# Patient Record
Sex: Male | Born: 1940
Health system: Southern US, Community
[De-identification: ages and names within clinical notes are randomized; demographics above are authoritative.]

---

## 2010-04-26 ENCOUNTER — Emergency Department (HOSPITAL_BASED_OUTPATIENT_CLINIC_OR_DEPARTMENT_OTHER): Admission: EM | Admit: 2010-04-26 | Discharge: 2010-04-26 | Payer: Self-pay | Admitting: Emergency Medicine

## 2010-04-26 ENCOUNTER — Ambulatory Visit: Payer: Self-pay | Admitting: Diagnostic Radiology

## 2010-11-27 LAB — DIFFERENTIAL
Basophils Absolute: 0 10*3/uL (ref 0.0–0.1)
Basophils Relative: 1 % (ref 0–1)
Eosinophils Relative: 1 % (ref 0–5)
Lymphocytes Relative: 20 % (ref 12–46)
Monocytes Relative: 9 % (ref 3–12)
Neutro Abs: 7 10*3/uL (ref 1.7–7.7)
Neutrophils Relative %: 69 % (ref 43–77)

## 2010-11-27 LAB — CBC
Hemoglobin: 13 g/dL (ref 13.0–17.0)
MCH: 29.5 pg (ref 26.0–34.0)
MCV: 86.9 fL (ref 78.0–100.0)
Platelets: 318 10*3/uL (ref 150–400)

## 2012-01-05 DIAGNOSIS — H0230 Blepharochalasis unspecified eye, unspecified eyelid: Secondary | ICD-10-CM | POA: Insufficient documentation

## 2012-03-09 ENCOUNTER — Other Ambulatory Visit (HOSPITAL_COMMUNITY): Payer: Self-pay | Admitting: Urology

## 2012-03-09 DIAGNOSIS — N179 Acute kidney failure, unspecified: Secondary | ICD-10-CM

## 2012-03-09 DIAGNOSIS — N1330 Unspecified hydronephrosis: Secondary | ICD-10-CM

## 2012-03-13 ENCOUNTER — Encounter (HOSPITAL_COMMUNITY)
Admission: RE | Admit: 2012-03-13 | Discharge: 2012-03-13 | Disposition: A | Discharge status: Home / Self Care | Payer: Commercial Managed Care - PPO | Source: Ambulatory Visit | Attending: Urology | Admitting: Urology

## 2012-03-13 DIAGNOSIS — N1330 Unspecified hydronephrosis: Secondary | ICD-10-CM

## 2012-03-13 DIAGNOSIS — N179 Acute kidney failure, unspecified: Secondary | ICD-10-CM

## 2012-03-13 DIAGNOSIS — N2889 Other specified disorders of kidney and ureter: Secondary | ICD-10-CM | POA: Insufficient documentation

## 2012-03-13 MED ORDER — TECHNETIUM TC 99M MERTIATIDE
15.3000 | Freq: Once | INTRAVENOUS | Status: AC | PRN
  Administered 2012-03-13: 15 via INTRAVENOUS

## 2012-03-13 MED ORDER — FUROSEMIDE 10 MG/ML IJ SOLN
40.0000 mg | Freq: Once | INTRAMUSCULAR | Status: DC
  Filled 2012-03-13: qty 4

## 2012-03-28 ENCOUNTER — Other Ambulatory Visit: Payer: Self-pay | Admitting: Urology

## 2012-04-04 ENCOUNTER — Encounter (HOSPITAL_COMMUNITY): Payer: Self-pay | Admitting: Pharmacy Technician

## 2012-04-07 ENCOUNTER — Inpatient Hospital Stay (HOSPITAL_COMMUNITY): Admission: RE | Admit: 2012-04-07 | Payer: Commercial Managed Care - PPO | Source: Ambulatory Visit

## 2012-04-11 ENCOUNTER — Encounter (HOSPITAL_COMMUNITY): Admission: RE | Payer: Self-pay | Source: Ambulatory Visit

## 2012-04-11 ENCOUNTER — Ambulatory Visit (HOSPITAL_COMMUNITY): Admission: RE | Admit: 2012-04-11 | Payer: Commercial Managed Care - PPO | Source: Ambulatory Visit | Admitting: Urology

## 2012-04-11 SURGERY — TRANSURETHRAL RESECTION OF THE PROSTATE WITH GYRUS INSTRUMENTS
Anesthesia: General

## 2012-05-24 ENCOUNTER — Ambulatory Visit: Payer: Self-pay | Admitting: Urology

## 2012-05-29 ENCOUNTER — Ambulatory Visit: Payer: Self-pay | Admitting: Urology

## 2013-11-20 DIAGNOSIS — M21619 Bunion of unspecified foot: Secondary | ICD-10-CM | POA: Diagnosis not present

## 2013-11-20 DIAGNOSIS — B351 Tinea unguium: Secondary | ICD-10-CM | POA: Diagnosis not present

## 2013-11-20 DIAGNOSIS — M2020 Hallux rigidus, unspecified foot: Secondary | ICD-10-CM | POA: Diagnosis not present

## 2013-12-19 DIAGNOSIS — R011 Cardiac murmur, unspecified: Secondary | ICD-10-CM | POA: Diagnosis not present

## 2013-12-19 DIAGNOSIS — R030 Elevated blood-pressure reading, without diagnosis of hypertension: Secondary | ICD-10-CM | POA: Diagnosis not present

## 2013-12-19 DIAGNOSIS — M79609 Pain in unspecified limb: Secondary | ICD-10-CM | POA: Diagnosis not present

## 2014-01-11 DIAGNOSIS — M21619 Bunion of unspecified foot: Secondary | ICD-10-CM | POA: Diagnosis not present

## 2014-01-11 DIAGNOSIS — M2020 Hallux rigidus, unspecified foot: Secondary | ICD-10-CM | POA: Diagnosis not present

## 2014-01-11 DIAGNOSIS — L84 Corns and callosities: Secondary | ICD-10-CM | POA: Diagnosis not present

## 2014-01-16 DIAGNOSIS — Z125 Encounter for screening for malignant neoplasm of prostate: Secondary | ICD-10-CM | POA: Diagnosis not present

## 2014-01-16 DIAGNOSIS — R030 Elevated blood-pressure reading, without diagnosis of hypertension: Secondary | ICD-10-CM | POA: Diagnosis not present

## 2014-01-16 DIAGNOSIS — C439 Malignant melanoma of skin, unspecified: Secondary | ICD-10-CM | POA: Diagnosis not present

## 2014-01-16 DIAGNOSIS — Z136 Encounter for screening for cardiovascular disorders: Secondary | ICD-10-CM | POA: Diagnosis not present

## 2014-01-16 DIAGNOSIS — Z0000 Encounter for general adult medical examination without abnormal findings: Secondary | ICD-10-CM | POA: Diagnosis not present

## 2014-01-16 DIAGNOSIS — N401 Enlarged prostate with lower urinary tract symptoms: Secondary | ICD-10-CM | POA: Diagnosis not present

## 2014-01-16 DIAGNOSIS — Z23 Encounter for immunization: Secondary | ICD-10-CM | POA: Diagnosis not present

## 2014-01-16 DIAGNOSIS — R7301 Impaired fasting glucose: Secondary | ICD-10-CM | POA: Diagnosis not present

## 2014-01-16 DIAGNOSIS — N138 Other obstructive and reflux uropathy: Secondary | ICD-10-CM | POA: Diagnosis not present

## 2014-01-16 DIAGNOSIS — Z79899 Other long term (current) drug therapy: Secondary | ICD-10-CM | POA: Diagnosis not present

## 2014-01-17 DIAGNOSIS — R82998 Other abnormal findings in urine: Secondary | ICD-10-CM | POA: Diagnosis not present

## 2014-01-17 DIAGNOSIS — C439 Malignant melanoma of skin, unspecified: Secondary | ICD-10-CM | POA: Diagnosis not present

## 2014-01-17 DIAGNOSIS — Z0000 Encounter for general adult medical examination without abnormal findings: Secondary | ICD-10-CM | POA: Diagnosis not present

## 2014-01-17 DIAGNOSIS — Z79899 Other long term (current) drug therapy: Secondary | ICD-10-CM | POA: Diagnosis not present

## 2014-01-17 DIAGNOSIS — N138 Other obstructive and reflux uropathy: Secondary | ICD-10-CM | POA: Diagnosis not present

## 2014-01-17 DIAGNOSIS — N401 Enlarged prostate with lower urinary tract symptoms: Secondary | ICD-10-CM | POA: Diagnosis not present

## 2014-01-17 DIAGNOSIS — Z125 Encounter for screening for malignant neoplasm of prostate: Secondary | ICD-10-CM | POA: Diagnosis not present

## 2014-01-17 DIAGNOSIS — R7301 Impaired fasting glucose: Secondary | ICD-10-CM | POA: Diagnosis not present

## 2014-01-17 DIAGNOSIS — R030 Elevated blood-pressure reading, without diagnosis of hypertension: Secondary | ICD-10-CM | POA: Diagnosis not present

## 2014-04-10 DIAGNOSIS — L821 Other seborrheic keratosis: Secondary | ICD-10-CM | POA: Diagnosis not present

## 2014-04-10 DIAGNOSIS — D239 Other benign neoplasm of skin, unspecified: Secondary | ICD-10-CM | POA: Diagnosis not present

## 2014-04-10 DIAGNOSIS — L570 Actinic keratosis: Secondary | ICD-10-CM | POA: Diagnosis not present

## 2014-04-10 DIAGNOSIS — L919 Hypertrophic disorder of the skin, unspecified: Secondary | ICD-10-CM | POA: Diagnosis not present

## 2014-04-10 DIAGNOSIS — L909 Atrophic disorder of skin, unspecified: Secondary | ICD-10-CM | POA: Diagnosis not present

## 2014-04-10 DIAGNOSIS — L819 Disorder of pigmentation, unspecified: Secondary | ICD-10-CM | POA: Diagnosis not present

## 2014-04-10 DIAGNOSIS — Z85820 Personal history of malignant melanoma of skin: Secondary | ICD-10-CM | POA: Diagnosis not present

## 2014-12-31 NOTE — H&P (Signed)
PATIENT NAME:  Randy Bradshaw, Randy Bradshaw MR#:  [id number] DATE OF BIRTH:  01/05/1941  DATE OF ADMISSION:  05/29/2012  CHIEF COMPLAINT: Urinary retention.   HISTORY OF PRESENT ILLNESS: [name] is a 74 year old white male with urinary retention since June of this year. He performs intermittent self-cath 4 times per day. He has tried Flomax and then [place], without resolution of his retention. He had urodynamics performed in July which indicated normal detrusor sensation with a bladder capacity of 1390 mL with mild instability. Maximum detrusor pressure was 25 cm of water pressure. He also has chronic scarring of the left kidney with 80% of the function resulting from the right kidney. Most recent PSA was 3.1. He also has a history of elevated PSA of 4.1. He comes in now for photovaporization of the prostate with green light laser.   ALLERGIES: No drug allergies.   CURRENT MEDICATIONS:  Jalyn and aspirin.   PAST SURGICAL HISTORY: Right inguinal herniorrhaphy.   SOCIAL HISTORY: Denied tobacco use. Consumes 1 to 4 alcoholic beverages per week.   FAMILY HISTORY: Negative for prostate cancer.   PAST AND CURRENT MEDICAL CONDITIONS: Borderline hypertension.   REVIEW OF SYSTEMS: The patient denied chest pain, heart disease, diabetes, or stroke.   PHYSICAL EXAMINATION:  GENERAL: Well-nourished white male in no distress.   HEENT: Sclerae were clear. Pupils were equally round and reactive to light and accommodation. Extraocular movements were intact.   NECK: Supple. No palpable cervical adenopathy.   LUNGS: Clear to auscultation.   CARDIOVASCULAR: Regular rhythm and rate without audible murmurs.   ABDOMEN: Soft, nontender abdomen.   GENITOURINARY: Circumcised. Testes smooth and nontender, 18 mL in size each.   RECTAL: 50- gram smooth, nontender prostate.   IMPRESSION: Urinary retention due to benign prostatic hypertrophy with bladder outlet obstruction.   PLAN: Photovaporization of the prostate with  green light laser.     ____________________________ Otelia Limes. Yves Dill, MD [email or web address] D: 05/24/2012 13:49:37 ET T: 05/24/2012 14:06:59 ET JOB#: [id number]  cc: Otelia Limes. Yves Dill, MD, <Dictator> Royston Cowper MD ELECTRONICALLY SIGNED 05/24/2012 15:24

## 2014-12-31 NOTE — Op Note (Signed)
PATIENT NAME:  Randy Bradshaw, Randy Bradshaw MR#:  [id number] DATE OF BIRTH:  01-19-1941  DATE OF PROCEDURE:  05/29/2012  PREOPERATIVE DIAGNOSES:  1. Urinary retention.  2. Benign prostatic hypertrophy.   POSTOPERATIVE DIAGNOSES:  1. Urinary retention.  2. Benign prostatic hypertrophy.   PROCEDURE PERFORMED: Photovaporization of the prostate with green light laser.   SURGEON: Maryan Puls, MD   ANESTHETIST: Julianne Handler, MD  ANESTHESIA: Spinal.   INDICATIONS: See the dictated history and physical. After informed consent, the patient requests the above procedure.   OPERATIVE SUMMARY: After adequate spinal anesthesia had been obtained, the patient was placed into the dorsal lithotomy position and the perineum was prepped and draped in the usual fashion. The laser scope was coupled with the camera and then visually advanced into the bladder. The bladder was heavily trabeculated with a few small diverticula present. Both ureteral orifices were identified and had clear efflux. The patient had trilobar benign prostatic hypertrophy with visual obstruction. He had intravesical growth of median lobe. At this point, the XPS laser fiber was introduced through the scope and power set at 80 watts. Bladder neck tissue and median lobe was vaporized. Power was then increased to 120 watts and obstructing tissue from the bladder neck to the verumontanum was vaporized. Finally, the power was increased to 180 watts and remaining obstructive tissue was vaporized. At this point, the scope      was removed and a 20 [place] catheter placed. The catheter was irrigated until clear. A B and O suppository was placed. The procedure was then terminated and the patient was transferred to the recovery room in stable condition.  ____________________________ Otelia Limes. Yves Dill, MD [email or web address] D: 05/29/2012 11:22:22 ET T: 05/29/2012 11:27:35 ET JOB#: [id number]  cc: Otelia Limes. Yves Dill, MD, <Dictator> Royston Cowper  MD ELECTRONICALLY SIGNED 05/29/2012 16:53

## 2015-04-09 DIAGNOSIS — R7301 Impaired fasting glucose: Secondary | ICD-10-CM | POA: Diagnosis not present

## 2015-04-09 DIAGNOSIS — E871 Hypo-osmolality and hyponatremia: Secondary | ICD-10-CM | POA: Diagnosis not present

## 2015-04-09 DIAGNOSIS — R8290 Unspecified abnormal findings in urine: Secondary | ICD-10-CM | POA: Diagnosis not present

## 2015-04-09 DIAGNOSIS — N289 Disorder of kidney and ureter, unspecified: Secondary | ICD-10-CM | POA: Diagnosis not present

## 2015-04-09 DIAGNOSIS — N401 Enlarged prostate with lower urinary tract symptoms: Secondary | ICD-10-CM | POA: Diagnosis not present

## 2015-04-09 DIAGNOSIS — C439 Malignant melanoma of skin, unspecified: Secondary | ICD-10-CM | POA: Diagnosis not present

## 2015-04-09 DIAGNOSIS — Z125 Encounter for screening for malignant neoplasm of prostate: Secondary | ICD-10-CM | POA: Diagnosis not present

## 2015-04-10 DIAGNOSIS — D225 Melanocytic nevi of trunk: Secondary | ICD-10-CM | POA: Diagnosis not present

## 2015-04-10 DIAGNOSIS — Z125 Encounter for screening for malignant neoplasm of prostate: Secondary | ICD-10-CM | POA: Diagnosis not present

## 2015-04-10 DIAGNOSIS — L570 Actinic keratosis: Secondary | ICD-10-CM | POA: Diagnosis not present

## 2015-04-10 DIAGNOSIS — Z86018 Personal history of other benign neoplasm: Secondary | ICD-10-CM | POA: Diagnosis not present

## 2015-04-10 DIAGNOSIS — L821 Other seborrheic keratosis: Secondary | ICD-10-CM | POA: Diagnosis not present

## 2015-04-10 DIAGNOSIS — C439 Malignant melanoma of skin, unspecified: Secondary | ICD-10-CM | POA: Diagnosis not present

## 2015-04-10 DIAGNOSIS — Z85820 Personal history of malignant melanoma of skin: Secondary | ICD-10-CM | POA: Diagnosis not present

## 2015-04-10 DIAGNOSIS — N289 Disorder of kidney and ureter, unspecified: Secondary | ICD-10-CM | POA: Diagnosis not present

## 2015-04-10 DIAGNOSIS — D485 Neoplasm of uncertain behavior of skin: Secondary | ICD-10-CM | POA: Diagnosis not present

## 2015-04-10 DIAGNOSIS — L820 Inflamed seborrheic keratosis: Secondary | ICD-10-CM | POA: Diagnosis not present

## 2015-04-10 DIAGNOSIS — L814 Other melanin hyperpigmentation: Secondary | ICD-10-CM | POA: Diagnosis not present

## 2015-04-10 DIAGNOSIS — R7301 Impaired fasting glucose: Secondary | ICD-10-CM | POA: Diagnosis not present

## 2015-04-10 DIAGNOSIS — R8290 Unspecified abnormal findings in urine: Secondary | ICD-10-CM | POA: Diagnosis not present

## 2015-04-10 DIAGNOSIS — D1800 Hemangioma unspecified site: Secondary | ICD-10-CM | POA: Diagnosis not present

## 2015-04-10 DIAGNOSIS — N401 Enlarged prostate with lower urinary tract symptoms: Secondary | ICD-10-CM | POA: Diagnosis not present

## 2015-04-10 DIAGNOSIS — E871 Hypo-osmolality and hyponatremia: Secondary | ICD-10-CM | POA: Diagnosis not present

## 2015-04-11 DIAGNOSIS — D225 Melanocytic nevi of trunk: Secondary | ICD-10-CM | POA: Diagnosis not present

## 2016-04-14 DIAGNOSIS — D225 Melanocytic nevi of trunk: Secondary | ICD-10-CM | POA: Diagnosis not present

## 2016-04-14 DIAGNOSIS — D485 Neoplasm of uncertain behavior of skin: Secondary | ICD-10-CM | POA: Diagnosis not present

## 2016-04-14 DIAGNOSIS — Z85820 Personal history of malignant melanoma of skin: Secondary | ICD-10-CM | POA: Diagnosis not present

## 2016-04-14 DIAGNOSIS — D1800 Hemangioma unspecified site: Secondary | ICD-10-CM | POA: Diagnosis not present

## 2016-04-14 DIAGNOSIS — Z86018 Personal history of other benign neoplasm: Secondary | ICD-10-CM | POA: Diagnosis not present

## 2016-04-14 DIAGNOSIS — L814 Other melanin hyperpigmentation: Secondary | ICD-10-CM | POA: Diagnosis not present

## 2016-04-14 DIAGNOSIS — L570 Actinic keratosis: Secondary | ICD-10-CM | POA: Diagnosis not present

## 2016-04-14 DIAGNOSIS — L821 Other seborrheic keratosis: Secondary | ICD-10-CM | POA: Diagnosis not present

## 2017-03-23 DIAGNOSIS — H0014 Chalazion left upper eyelid: Secondary | ICD-10-CM | POA: Diagnosis not present

## 2017-03-25 DIAGNOSIS — H0014 Chalazion left upper eyelid: Secondary | ICD-10-CM | POA: Diagnosis not present

## 2017-03-25 DIAGNOSIS — H0016 Chalazion left eye, unspecified eyelid: Secondary | ICD-10-CM | POA: Diagnosis not present

## 2017-04-12 DIAGNOSIS — H0014 Chalazion left upper eyelid: Secondary | ICD-10-CM | POA: Diagnosis not present

## 2017-04-19 DIAGNOSIS — L814 Other melanin hyperpigmentation: Secondary | ICD-10-CM | POA: Diagnosis not present

## 2017-04-19 DIAGNOSIS — D1800 Hemangioma unspecified site: Secondary | ICD-10-CM | POA: Diagnosis not present

## 2017-04-19 DIAGNOSIS — Z86018 Personal history of other benign neoplasm: Secondary | ICD-10-CM | POA: Diagnosis not present

## 2017-04-19 DIAGNOSIS — Z85820 Personal history of malignant melanoma of skin: Secondary | ICD-10-CM | POA: Diagnosis not present

## 2017-04-19 DIAGNOSIS — L821 Other seborrheic keratosis: Secondary | ICD-10-CM | POA: Diagnosis not present

## 2017-04-19 DIAGNOSIS — L570 Actinic keratosis: Secondary | ICD-10-CM | POA: Diagnosis not present

## 2017-04-19 DIAGNOSIS — D225 Melanocytic nevi of trunk: Secondary | ICD-10-CM | POA: Diagnosis not present

## 2017-04-20 DIAGNOSIS — M2041 Other hammer toe(s) (acquired), right foot: Secondary | ICD-10-CM | POA: Diagnosis not present

## 2017-04-20 DIAGNOSIS — M2021 Hallux rigidus, right foot: Secondary | ICD-10-CM | POA: Diagnosis not present

## 2017-04-20 DIAGNOSIS — M21621 Bunionette of right foot: Secondary | ICD-10-CM | POA: Diagnosis not present

## 2017-05-17 DIAGNOSIS — M21621 Bunionette of right foot: Secondary | ICD-10-CM | POA: Insufficient documentation

## 2017-05-17 DIAGNOSIS — M2141 Flat foot [pes planus] (acquired), right foot: Secondary | ICD-10-CM | POA: Diagnosis not present

## 2017-05-17 DIAGNOSIS — M79671 Pain in right foot: Secondary | ICD-10-CM | POA: Diagnosis not present

## 2017-05-17 DIAGNOSIS — M19071 Primary osteoarthritis, right ankle and foot: Secondary | ICD-10-CM | POA: Diagnosis not present

## 2017-05-17 DIAGNOSIS — M2021 Hallux rigidus, right foot: Secondary | ICD-10-CM | POA: Diagnosis not present

## 2017-05-17 DIAGNOSIS — S93144A Subluxation of metatarsophalangeal joint of right lesser toe(s), initial encounter: Secondary | ICD-10-CM | POA: Diagnosis not present

## 2017-06-27 ENCOUNTER — Ambulatory Visit (HOSPITAL_BASED_OUTPATIENT_CLINIC_OR_DEPARTMENT_OTHER): Admit: 2017-06-27 | Payer: Commercial Managed Care - PPO | Admitting: Podiatry

## 2017-06-27 ENCOUNTER — Encounter (HOSPITAL_BASED_OUTPATIENT_CLINIC_OR_DEPARTMENT_OTHER): Payer: Self-pay

## 2017-06-27 SURGERY — FUSION, JOINT, GREAT TOE
Anesthesia: General | Laterality: Right

## 2017-09-23 DIAGNOSIS — B9689 Other specified bacterial agents as the cause of diseases classified elsewhere: Secondary | ICD-10-CM | POA: Diagnosis not present

## 2017-09-23 DIAGNOSIS — J329 Chronic sinusitis, unspecified: Secondary | ICD-10-CM | POA: Diagnosis not present

## 2017-09-23 DIAGNOSIS — R05 Cough: Secondary | ICD-10-CM | POA: Diagnosis not present

## 2017-10-17 DIAGNOSIS — M21621 Bunionette of right foot: Secondary | ICD-10-CM | POA: Diagnosis not present

## 2017-10-17 DIAGNOSIS — M2041 Other hammer toe(s) (acquired), right foot: Secondary | ICD-10-CM | POA: Diagnosis not present

## 2017-10-17 DIAGNOSIS — M2021 Hallux rigidus, right foot: Secondary | ICD-10-CM | POA: Diagnosis not present

## 2017-11-17 ENCOUNTER — Ambulatory Visit (INDEPENDENT_AMBULATORY_CARE_PROVIDER_SITE_OTHER): Payer: Medicare Other | Admitting: Family Medicine

## 2017-11-17 ENCOUNTER — Encounter: Payer: Self-pay | Admitting: Family Medicine

## 2017-11-17 DIAGNOSIS — M25562 Pain in left knee: Secondary | ICD-10-CM

## 2017-11-17 NOTE — Patient Instructions (Signed)
Your pain and swelling are due to a flare of mild arthritis. These are the different medications you can take for this: Tylenol 500mg  1-2 tabs three times a day for pain. Aleve 1-2 tabs twice a day with food only if needed. Cortisone injections are an option only if this is severe. It's important that you continue to stay active. Straight leg raises, knee extensions 3 sets of 10 once a day (add ankle weight if these become too easy). Compression sleeve or ACE wrap can help with support and the swelling. Elevate above your heart when possible. Icing 15 minutes at a time 3-4 times a day until swelling has resolved. Follow up with me as needed.

## 2017-11-18 ENCOUNTER — Encounter: Payer: Self-pay | Admitting: Family Medicine

## 2017-11-18 DIAGNOSIS — M25562 Pain in left knee: Secondary | ICD-10-CM | POA: Insufficient documentation

## 2017-11-18 NOTE — Progress Notes (Signed)
PCP: Hulan Fess, MD  Subjective:   HPI: Patient is a 77 y.o. male here for left knee pain.  Patient reports that he was at the beach about 5-6 days ago taking down some wallpaper. He recalls having to squat, work on his knees, and thinks that turning his knees may have aggravated this. No acute injury. He noticed a lot of swelling and some pain anteriorly. He took some Advil last night and feels like this is made a big difference. No prior issues with his left knee. Pain was worse with walking, a soreness. No skin changes or numbness.  History reviewed. No pertinent past medical history.  Current Outpatient Medications on File Prior to Visit  Medication Sig Dispense Refill  . aspirin 81 MG chewable tablet Chew 81 mg by mouth every morning.    . ciprofloxacin (CIPRO) 250 MG tablet Take 250 mg by mouth See admin instructions. Started 03/28/2012. Take 1 tablet by mouth  twice a day for 5 days then 1 tablet daily until procedure date    . Misc Natural Products (PROSTATE HEALTH PO) Take 1 tablet by mouth every morning.    Marland Kitchen OVER THE COUNTER MEDICATION Take by mouth daily. [employer]=Multivitamin Pack    . Red Yeast Rice 600 MG TABS Take 600 mg by mouth every morning.    . saw palmetto 160 MG capsule Take 160 mg by mouth every morning.     No current facility-administered medications on file prior to visit.     History reviewed. No pertinent surgical history.  No Known Allergies  Social History   Socioeconomic History  . Marital status: Married    Spouse name: Not on file  . Number of children: Not on file  . Years of education: Not on file  . Highest education level: Not on file  Social Needs  . Financial resource strain: Not on file  . Food insecurity - worry: Not on file  . Food insecurity - inability: Not on file  . Transportation needs - medical: Not on file  . Transportation needs - non-medical: Not on file  Occupational History  . Not on file  Tobacco Use  . Smoking  status: Never Smoker  . Smokeless tobacco: Never Used  Substance and Sexual Activity  . Alcohol use: Not on file  . Drug use: Not on file  . Sexual activity: Not on file  Other Topics Concern  . Not on file  Social History Narrative  . Not on file    History reviewed. No pertinent family history.  BP (!) 152/70   Ht 5\' 10"  (1.778 m)   Wt 165 lb (74.8 kg)   BMI 23.68 kg/m   Review of Systems: See HPI above.     Objective:  Physical Exam:  Gen: NAD, comfortable in exam room  Left knee: Mild effusion.  No other gross deformity, ecchymoses. No TTP. FROM with 5/5 strength. Negative ant/post drawers. Negative valgus/varus testing. Negative lachmanns. Negative mcmurrays, apleys, patellar apprehension. NV intact distally.  Right knee: No deformity. FROM with 5/5 strength. No tenderness to palpation. NVI distally.   Assessment & Plan:  1.  Left knee pain- patient has improved over the past 24 hours.  His pain and swelling are consistent with a flare of mild arthritis.  Recommended that he consider Tylenol and/or Aleve as needed for pain.  Cortisone injections are an option only if the pain is severe.  He was encouraged to stay active and shown some home exercises to do  daily.  Compression sleeve or Ace wrap to help with support and swelling.  Icing and elevation to help with swelling.  He will follow-up as needed.

## 2017-11-18 NOTE — Assessment & Plan Note (Signed)
patient has improved over the past 24 hours.  His pain and swelling are consistent with a flare of mild arthritis.  Recommended that he consider Tylenol and/or Aleve as needed for pain.  Cortisone injections are an option only if the pain is severe.  He was encouraged to stay active and shown some home exercises to do daily.  Compression sleeve or Ace wrap to help with support and swelling.  Icing and elevation to help with swelling.  He will follow-up as needed.

## 2017-11-30 DIAGNOSIS — I10 Essential (primary) hypertension: Secondary | ICD-10-CM | POA: Diagnosis not present

## 2017-11-30 DIAGNOSIS — M21621 Bunionette of right foot: Secondary | ICD-10-CM | POA: Diagnosis not present

## 2017-11-30 DIAGNOSIS — M2041 Other hammer toe(s) (acquired), right foot: Secondary | ICD-10-CM | POA: Diagnosis not present

## 2017-11-30 DIAGNOSIS — M205X1 Other deformities of toe(s) (acquired), right foot: Secondary | ICD-10-CM | POA: Diagnosis not present

## 2017-11-30 DIAGNOSIS — Z79899 Other long term (current) drug therapy: Secondary | ICD-10-CM | POA: Diagnosis not present

## 2017-11-30 DIAGNOSIS — Z87891 Personal history of nicotine dependence: Secondary | ICD-10-CM | POA: Diagnosis not present

## 2017-11-30 DIAGNOSIS — M2011 Hallux valgus (acquired), right foot: Secondary | ICD-10-CM | POA: Diagnosis not present

## 2017-11-30 DIAGNOSIS — M2021 Hallux rigidus, right foot: Secondary | ICD-10-CM | POA: Diagnosis not present

## 2017-11-30 DIAGNOSIS — Z9889 Other specified postprocedural states: Secondary | ICD-10-CM | POA: Diagnosis not present

## 2017-12-14 DIAGNOSIS — Z9889 Other specified postprocedural states: Secondary | ICD-10-CM | POA: Diagnosis not present

## 2017-12-14 DIAGNOSIS — Z8719 Personal history of other diseases of the digestive system: Secondary | ICD-10-CM | POA: Diagnosis not present

## 2017-12-14 DIAGNOSIS — Z4789 Encounter for other orthopedic aftercare: Secondary | ICD-10-CM | POA: Diagnosis not present

## 2018-02-23 DIAGNOSIS — Z87891 Personal history of nicotine dependence: Secondary | ICD-10-CM | POA: Diagnosis not present

## 2018-02-23 DIAGNOSIS — M21621 Bunionette of right foot: Secondary | ICD-10-CM | POA: Diagnosis not present

## 2018-02-23 DIAGNOSIS — Z09 Encounter for follow-up examination after completed treatment for conditions other than malignant neoplasm: Secondary | ICD-10-CM | POA: Diagnosis not present

## 2018-04-13 ENCOUNTER — Encounter: Payer: Self-pay | Admitting: Family Medicine

## 2018-04-13 ENCOUNTER — Ambulatory Visit (INDEPENDENT_AMBULATORY_CARE_PROVIDER_SITE_OTHER): Payer: Medicare Other | Admitting: Family Medicine

## 2018-04-13 VITALS — BP 154/70 | Ht 70.0 in | Wt 165.0 lb

## 2018-04-13 DIAGNOSIS — M25562 Pain in left knee: Secondary | ICD-10-CM

## 2018-04-13 MED ORDER — METHYLPREDNISOLONE ACETATE 40 MG/ML IJ SUSP: 40.0000 mg | Freq: Once | INTRAMUSCULAR | Status: AC

## 2018-04-13 NOTE — Progress Notes (Signed)
PCP: Hulan Fess, MD  Subjective:   HPI: Patient is a 77 y.o. male here for left knee pain.  3/7: Patient reports that he was at the beach about 5-6 days ago taking down some wallpaper. He recalls having to squat, work on his knees, and thinks that turning his knees may have aggravated this. No acute injury. He noticed a lot of swelling and some pain anteriorly. He took some Advil last night and feels like this is made a big difference. No prior issues with his left knee. Pain was worse with walking, a soreness. No skin changes or numbness.  8/1: Patient returns with pain on the anterior posterior aspect of his left knee. He states he is done well but then had surgery on his right foot causing him to be in a postop shoe for about 8 to 9 weeks. Feels like when he was doing this he was overcompensating putting more pressure on his left leg. His left knee now feels like it wants to give way especially by the end of the day. His pain is a 3 out of 10 and more achy, also worse at the end of the day and with a lot of standing or walking. He is icing at night which helps.  He is also tried blue emu and occasional Advil. No new injuries. No skin changes or numbness.  History reviewed. No pertinent past medical history.  Current Outpatient Medications on File Prior to Visit  Medication Sig Dispense Refill  . aspirin 81 MG chewable tablet Chew 81 mg by mouth every morning.    . ciprofloxacin (CIPRO) 250 MG tablet Take 250 mg by mouth See admin instructions. Started 03/28/2012. Take 1 tablet by mouth  twice a day for 5 days then 1 tablet daily until procedure date    . Misc Natural Products (PROSTATE HEALTH PO) Take 1 tablet by mouth every morning.    Marland Kitchen OVER THE COUNTER MEDICATION Take by mouth daily. [employer]=Multivitamin Pack    . Red Yeast Rice 600 MG TABS Take 600 mg by mouth every morning.    . saw palmetto 160 MG capsule Take 160 mg by mouth every morning.     No current  facility-administered medications on file prior to visit.     History reviewed. No pertinent surgical history.  No Known Allergies  Social History   Socioeconomic History  . Marital status: Married    Spouse name: Not on file  . Number of children: Not on file  . Years of education: Not on file  . Highest education level: Not on file  Occupational History  . Not on file  Social Needs  . Financial resource strain: Not on file  . Food insecurity:    Worry: Not on file    Inability: Not on file  . Transportation needs:    Medical: Not on file    Non-medical: Not on file  Tobacco Use  . Smoking status: Never Smoker  . Smokeless tobacco: Never Used  Substance and Sexual Activity  . Alcohol use: Not on file  . Drug use: Not on file  . Sexual activity: Not on file  Lifestyle  . Physical activity:    Days per week: Not on file    Minutes per session: Not on file  . Stress: Not on file  Relationships  . Social connections:    Talks on phone: Not on file    Gets together: Not on file    Attends religious service: Not  on file    Active member of club or organization: Not on file    Attends meetings of clubs or organizations: Not on file    Relationship status: Not on file  . Intimate partner violence:    Fear of current or ex partner: Not on file    Emotionally abused: Not on file    Physically abused: Not on file    Forced sexual activity: Not on file  Other Topics Concern  . Not on file  Social History Narrative  . Not on file    History reviewed. No pertinent family history.  BP (!) 154/70   Ht 5\' 10"  (1.778 m)   Wt 165 lb (74.8 kg)   BMI 23.68 kg/m   Review of Systems: See HPI above.     Objective:  Physical Exam:  Gen: NAD, comfortable in exam room  Left knee: Mild effusion.  No other deformity, ecchymoses.  Small fullness medial posterior knee. No TTP currently. FROM with 5/5 strength flexion and extension. Negative ant/post drawers. Negative  valgus/varus testing. Negative lachmanns. Negative mcmurrays, apleys, patellar apprehension. NV intact distally.  Right knee: No deformity. FROM with 5/5 strength. No tenderness to palpation. NVI distally.  MSK u/s left knee: Mild effusion.  Mild arthropathy medially.  Tiny baker's cyst.  No obvious medial meniscus tears.   Assessment & Plan:  1.  Left knee pain - consistent with flare of arthritis.  Believe a lot of his symptoms are due to the effusion which gets worse when he is on his feet a lot and by the end of the day.  He was given an intra-articular cortisone injection today.  We discussed Tylenol, Aleve.  Shown home exercises to do daily.  Compression sleeve or Ace wrap for support.  Icing up to 3-4 times a day.  F/u prn if doing well.  After informed written consent timeout was performed, patient was lying supine on exam table. Left knee was prepped with alcohol swab and utilizing superolateral approach with ultrasound guidance, patient's left knee was injected intraarticularly with 3:1 bupivicaine: depomedrol. Patient tolerated the procedure well without immediate complications.

## 2018-04-13 NOTE — Patient Instructions (Signed)
Your pain and swelling are due to a flare of mild arthritis. These are the different medications you can take for this: Tylenol 500mg  1-2 tabs three times a day for pain. Aleve 1-2 tabs twice a day with food only if needed. Cortisone injections are an option - you were given this today It's important that you continue to stay active. Straight leg raises, knee extensions 3 sets of 10 once a day (add ankle weight if these become too easy). Compression sleeve or ACE wrap can help with support and the swelling. Elevate above your heart when possible. Icing 15 minutes at a time 3-4 times a day until swelling has resolved. Follow up with me as needed.

## 2018-06-14 DIAGNOSIS — D2272 Melanocytic nevi of left lower limb, including hip: Secondary | ICD-10-CM | POA: Diagnosis not present

## 2018-06-14 DIAGNOSIS — L814 Other melanin hyperpigmentation: Secondary | ICD-10-CM | POA: Diagnosis not present

## 2018-06-14 DIAGNOSIS — L821 Other seborrheic keratosis: Secondary | ICD-10-CM | POA: Diagnosis not present

## 2018-06-14 DIAGNOSIS — L570 Actinic keratosis: Secondary | ICD-10-CM | POA: Diagnosis not present

## 2018-06-14 DIAGNOSIS — Z86018 Personal history of other benign neoplasm: Secondary | ICD-10-CM | POA: Diagnosis not present

## 2018-06-14 DIAGNOSIS — D485 Neoplasm of uncertain behavior of skin: Secondary | ICD-10-CM | POA: Diagnosis not present

## 2018-06-14 DIAGNOSIS — D225 Melanocytic nevi of trunk: Secondary | ICD-10-CM | POA: Diagnosis not present

## 2018-06-14 DIAGNOSIS — Z85820 Personal history of malignant melanoma of skin: Secondary | ICD-10-CM | POA: Diagnosis not present

## 2018-06-14 DIAGNOSIS — Z23 Encounter for immunization: Secondary | ICD-10-CM | POA: Diagnosis not present

## 2018-10-25 DIAGNOSIS — B9689 Other specified bacterial agents as the cause of diseases classified elsewhere: Secondary | ICD-10-CM | POA: Diagnosis not present

## 2018-10-25 DIAGNOSIS — J329 Chronic sinusitis, unspecified: Secondary | ICD-10-CM | POA: Diagnosis not present

## 2018-11-01 ENCOUNTER — Ambulatory Visit (HOSPITAL_BASED_OUTPATIENT_CLINIC_OR_DEPARTMENT_OTHER)
Admission: RE | Admit: 2018-11-01 | Discharge: 2018-11-01 | Disposition: A | Discharge status: Home / Self Care | Payer: Medicare Other | Source: Ambulatory Visit | Attending: Family Medicine | Admitting: Family Medicine

## 2018-11-01 ENCOUNTER — Encounter: Payer: Self-pay | Admitting: Family Medicine

## 2018-11-01 ENCOUNTER — Ambulatory Visit (INDEPENDENT_AMBULATORY_CARE_PROVIDER_SITE_OTHER): Payer: Medicare Other | Admitting: Family Medicine

## 2018-11-01 VITALS — BP 177/91 | HR 105 | Ht 68.0 in | Wt 165.0 lb

## 2018-11-01 DIAGNOSIS — M722 Plantar fascial fibromatosis: Secondary | ICD-10-CM

## 2018-11-01 DIAGNOSIS — M25562 Pain in left knee: Secondary | ICD-10-CM | POA: Insufficient documentation

## 2018-11-01 DIAGNOSIS — M1712 Unilateral primary osteoarthritis, left knee: Secondary | ICD-10-CM | POA: Diagnosis not present

## 2018-11-01 NOTE — Patient Instructions (Addendum)
We will go ahead with an MRI of your knee to further assess for possible meniscus tear.  You have plantar fasciitis Take tylenol and/or aleve as needed for pain  Plantar fascia stretch for 20-30 seconds (do 3 of these) in morning Lowering/raise on a step exercises 3 x 10 once or twice a day - this is very important for long term recovery. Can add heel walks, toe walks forward and backward as well Ice heel for 15 minutes as needed. Avoid flat shoes/barefoot walking as much as possible. Arch straps have been shown to help with pain. Inserts are important (dr. Zoe Lan active series, spencos, our green insoles, custom orthotics). Steroid injection is a consideration for short term pain relief if you are struggling. Physical therapy is also an option. Follow up with me in 1 month or as needed for this.

## 2018-11-01 NOTE — Progress Notes (Addendum)
PCP: Hulan Fess, MD  Subjective:   HPI: Patient is a 78 y.o. male here for left knee pain.  3/7: Patient reports that he was at the beach about 5-6 days ago taking down some wallpaper. He recalls having to squat, work on his knees, and thinks that turning his knees may have aggravated this. No acute injury. He noticed a lot of swelling and some pain anteriorly. He took some Advil last night and feels like this is made a big difference. No prior issues with his left knee. Pain was worse with walking, a soreness. No skin changes or numbness.  8/1: Patient returns with pain on the anterior posterior aspect of his left knee. He states he is done well but then had surgery on his right foot causing him to be in a postop shoe for about 8 to 9 weeks. Feels like when he was doing this he was overcompensating putting more pressure on his left leg. His left knee now feels like it wants to give way especially by the end of the day. His pain is a 3 out of 10 and more achy, also worse at the end of the day and with a lot of standing or walking. He is icing at night which helps.  He is also tried blue emu and occasional Advil. No new injuries. No skin changes or numbness.  11/01/18: Patient reports he's struggled with pain in anterior left knee since last visit. Injection only helped for about 5 days. Problems with going up and down stairs with a sharp pain more medially. Pain currently 0/10. Wearing brace and doing home exercises, going to gym. Feels like a needle sensation medial knee. Not taking any medicine for this. Also with pain in his left arch since Saturday - no acute trauma or injury. No skin changes, numbness.  History reviewed. No pertinent past medical history.  Current Outpatient Medications on File Prior to Visit  Medication Sig Dispense Refill  . aspirin 81 MG chewable tablet Chew 81 mg by mouth every morning.    . ciprofloxacin (CIPRO) 250 MG tablet Take 250 mg by  mouth See admin instructions. Started 03/28/2012. Take 1 tablet by mouth  twice a day for 5 days then 1 tablet daily until procedure date    . Misc Natural Products (PROSTATE HEALTH PO) Take 1 tablet by mouth every morning.    Marland Kitchen OVER THE COUNTER MEDICATION Take by mouth daily. [employer]=Multivitamin Pack    . Red Yeast Rice 600 MG TABS Take 600 mg by mouth every morning.    . saw palmetto 160 MG capsule Take 160 mg by mouth every morning.     Current Facility-Administered Medications on File Prior to Visit  Medication Dose Route Frequency Provider Last Rate Last Dose  . methylPREDNISolone acetate (DEPO-MEDROL) injection 40 mg  40 mg Intra-articular Once Yanni Quiroa, Sharyn Lull, MD        History reviewed. No pertinent surgical history.  No Known Allergies  Social History   Socioeconomic History  . Marital status: Married    Spouse name: Not on file  . Number of children: Not on file  . Years of education: Not on file  . Highest education level: Not on file  Occupational History  . Not on file  Social Needs  . Financial resource strain: Not on file  . Food insecurity:    Worry: Not on file    Inability: Not on file  . Transportation needs:    Medical: Not on file  Non-medical: Not on file  Tobacco Use  . Smoking status: Never Smoker  . Smokeless tobacco: Never Used  Substance and Sexual Activity  . Alcohol use: Not on file  . Drug use: Not on file  . Sexual activity: Not on file  Lifestyle  . Physical activity:    Days per week: Not on file    Minutes per session: Not on file  . Stress: Not on file  Relationships  . Social connections:    Talks on phone: Not on file    Gets together: Not on file    Attends religious service: Not on file    Active member of club or organization: Not on file    Attends meetings of clubs or organizations: Not on file    Relationship status: Not on file  . Intimate partner violence:    Fear of current or ex partner: Not on file    Emotionally  abused: Not on file    Physically abused: Not on file    Forced sexual activity: Not on file  Other Topics Concern  . Not on file  Social History Narrative  . Not on file    History reviewed. No pertinent family history.  BP (!) 177/91   Pulse (!) 105   Ht 5\' 8"  (1.727 m)   Wt 165 lb (74.8 kg)   BMI 25.09 kg/m   Review of Systems: See HPI above.     Objective:  Physical Exam:  Gen: NAD, comfortable in exam room  Left knee: Minimal effusion.  No other gross deformity, ecchymoses. No TTP. FROM with 5/5 strength flexion and extension. Negative ant/post drawers. Negative valgus/varus testing. Negative lachmans. Negative mcmurrays, apleys, patellar apprehension.  Positive thessalys. NV intact distally.  Left foot/ankle: Overpronation.  No gross deformity, swelling, ecchymoses FROM with 5/5 strength all directions. TTP in body of plantar fascia.  No other tenderness. Negative ant drawer and talar tilt.   Negative syndesmotic compression. Thompsons test negative. NV intact distally.   Assessment & Plan:  1.  Left knee pain - unfortunately did not improve as would expect from injection if this were from arthritis.  Independently reviewed radiographs from today and only mild arthritis visibile medially.  Positive thessalys - will go ahead with MRI to further assess since not responding to HEP and injection.  2. Left plantar fasciitis - reviewed home exercises and stretches.  Arch binders, arch support.  Icing, tylenol or aleve.  F/u in 1 month or as needed.  Total visit time 25 minutes - > 50% of which spent on counseling and answering questions.  Addendum:  MRI reviewed and discussed with patient.  He has medial meniscus tear along with degenerative OCD and more severe arthritis than seen on radiographs.  We discussed surgical intervention and doubt arthroscopy would provide benefit given level of arthritis with the OCD and meniscus tear.  He wants to try  viscosupplementation first - will order.

## 2019-01-30 DIAGNOSIS — M1712 Unilateral primary osteoarthritis, left knee: Secondary | ICD-10-CM | POA: Diagnosis not present

## 2019-01-30 DIAGNOSIS — M25462 Effusion, left knee: Secondary | ICD-10-CM | POA: Diagnosis not present

## 2019-01-30 DIAGNOSIS — M23222 Derangement of posterior horn of medial meniscus due to old tear or injury, left knee: Secondary | ICD-10-CM | POA: Diagnosis not present

## 2019-01-30 DIAGNOSIS — M899 Disorder of bone, unspecified: Secondary | ICD-10-CM | POA: Diagnosis not present

## 2019-01-30 DIAGNOSIS — M25562 Pain in left knee: Secondary | ICD-10-CM | POA: Diagnosis not present

## 2019-02-13 ENCOUNTER — Encounter: Payer: Self-pay | Admitting: Family Medicine

## 2019-02-19 ENCOUNTER — Ambulatory Visit: Payer: Medicare Other | Admitting: Family Medicine

## 2019-02-21 ENCOUNTER — Encounter: Payer: Self-pay | Admitting: Family Medicine

## 2019-02-21 ENCOUNTER — Other Ambulatory Visit: Payer: Self-pay

## 2019-02-21 ENCOUNTER — Ambulatory Visit (INDEPENDENT_AMBULATORY_CARE_PROVIDER_SITE_OTHER): Payer: Medicare Other | Admitting: Family Medicine

## 2019-02-21 DIAGNOSIS — M1712 Unilateral primary osteoarthritis, left knee: Secondary | ICD-10-CM

## 2019-02-21 NOTE — Patient Instructions (Signed)
Follow up with me in 1 week for the second injection. No specific restrictions following the injection though.

## 2019-02-21 NOTE — Progress Notes (Signed)
PCP: Hulan Fess, MD  Subjective:   HPI: Patient is a 79 y.o. male here for left knee pain.  3/7: Patient reports that he was at the beach about 5-6 days ago taking down some wallpaper. He recalls having to squat, work on his knees, and thinks that turning his knees may have aggravated this. No acute injury. He noticed a lot of swelling and some pain anteriorly. He took some Advil last night and feels like this is made a big difference. No prior issues with his left knee. Pain was worse with walking, a soreness. No skin changes or numbness.  8/1: Patient returns with pain on the anterior posterior aspect of his left knee. He states he is done well but then had surgery on his right foot causing him to be in a postop shoe for about 8 to 9 weeks. Feels like when he was doing this he was overcompensating putting more pressure on his left leg. His left knee now feels like it wants to give way especially by the end of the day. His pain is a 3 out of 10 and more achy, also worse at the end of the day and with a lot of standing or walking. He is icing at night which helps.  He is also tried blue emu and occasional Advil. No new injuries. No skin changes or numbness.  11/01/18: Patient reports he's struggled with pain in anterior left knee since last visit. Injection only helped for about 5 days. Problems with going up and down stairs with a sharp pain more medially. Pain currently 0/10. Wearing brace and doing home exercises, going to gym. Feels like a needle sensation medial knee. Not taking any medicine for this. Also with pain in his left arch since Saturday - no acute trauma or injury. No skin changes, numbness.  6/10: Patient comes in today to start viscosupplementation. Pain worse with stairs in left knee. Mild swelling.    History reviewed. No pertinent past medical history.  Current Outpatient Medications on File Prior to Visit  Medication Sig Dispense Refill  .  aspirin 81 MG chewable tablet Chew 81 mg by mouth every morning.    . ciprofloxacin (CIPRO) 250 MG tablet Take 250 mg by mouth See admin instructions. Started 03/28/2012. Take 1 tablet by mouth  twice a day for 5 days then 1 tablet daily until procedure date    . Misc Natural Products (PROSTATE HEALTH PO) Take 1 tablet by mouth every morning.    Marland Kitchen OVER THE COUNTER MEDICATION Take by mouth daily. [employer]=Multivitamin Pack    . Red Yeast Rice 600 MG TABS Take 600 mg by mouth every morning.    . saw palmetto 160 MG capsule Take 160 mg by mouth every morning.     Current Facility-Administered Medications on File Prior to Visit  Medication Dose Route Frequency Provider Last Rate Last Dose  . methylPREDNISolone acetate (DEPO-MEDROL) injection 40 mg  40 mg Intra-articular Once Hudnall, Sharyn Lull, MD        History reviewed. No pertinent surgical history.  No Known Allergies  Social History   Socioeconomic History  . Marital status: Married    Spouse name: Not on file  . Number of children: Not on file  . Years of education: Not on file  . Highest education level: Not on file  Occupational History  . Not on file  Social Needs  . Financial resource strain: Not on file  . Food insecurity:    Worry: Not  on file    Inability: Not on file  . Transportation needs:    Medical: Not on file    Non-medical: Not on file  Tobacco Use  . Smoking status: Never Smoker  . Smokeless tobacco: Never Used  Substance and Sexual Activity  . Alcohol use: Not on file  . Drug use: Not on file  . Sexual activity: Not on file  Lifestyle  . Physical activity:    Days per week: Not on file    Minutes per session: Not on file  . Stress: Not on file  Relationships  . Social connections:    Talks on phone: Not on file    Gets together: Not on file    Attends religious service: Not on file    Active member of club or organization: Not on file    Attends meetings of clubs or organizations: Not on file     Relationship status: Not on file  . Intimate partner violence:    Fear of current or ex partner: Not on file    Emotionally abused: Not on file    Physically abused: Not on file    Forced sexual activity: Not on file  Other Topics Concern  . Not on file  Social History Narrative  . Not on file    History reviewed. No pertinent family history.  BP (!) 160/80   Ht 5\' 10"  (1.778 m)   Wt 165 lb (74.8 kg)   BMI 23.68 kg/m   Review of Systems: See HPI above.     Objective:  Physical Exam:  Gen: NAD, comfortable in exam room  Rest of exam not repeated today.  Left knee: Minimal effusion.  No other gross deformity, ecchymoses. No TTP. FROM with 5/5 strength flexion and extension. Negative ant/post drawers. Negative valgus/varus testing. Negative lachmans. Negative mcmurrays, apleys, patellar apprehension.  Positive thessalys. NV intact distally.  Left foot/ankle: Overpronation.  No gross deformity, swelling, ecchymoses FROM with 5/5 strength all directions. TTP in body of plantar fascia.  No other tenderness. Negative ant drawer and talar tilt.   Negative syndesmotic compression. Thompsons test negative. NV intact distally.   Assessment & Plan:  1.  Left knee pain - 2/2 combination of arthritis, degenerative OCD, and medial meniscus tear.  Start orthovisc series today - f/u in 1 week for second injection.    After informed written consent timeout was performed, patient was lying supine on exam table. Left knee was prepped with alcohol swab and utilizing superolateral approach with ultrasound guidance, patient's left knee was injected intraarticularly with 32mL bupivicaine followed by orthovisc. Patient tolerated the procedure well without immediate complications.

## 2019-02-28 ENCOUNTER — Encounter: Payer: Self-pay | Admitting: Family Medicine

## 2019-02-28 ENCOUNTER — Other Ambulatory Visit: Payer: Self-pay

## 2019-02-28 ENCOUNTER — Ambulatory Visit (INDEPENDENT_AMBULATORY_CARE_PROVIDER_SITE_OTHER): Payer: Medicare Other | Admitting: Family Medicine

## 2019-02-28 DIAGNOSIS — M1712 Unilateral primary osteoarthritis, left knee: Secondary | ICD-10-CM

## 2019-02-28 NOTE — Progress Notes (Signed)
PCP: Hulan Fess, MD  Subjective:   HPI: Patient is a 78 y.o. male here for left knee pain.  3/7: Patient reports that he was at the beach about 5-6 days ago taking down some wallpaper. He recalls having to squat, work on his knees, and thinks that turning his knees may have aggravated this. No acute injury. He noticed a lot of swelling and some pain anteriorly. He took some Advil last night and feels like this is made a big difference. No prior issues with his left knee. Pain was worse with walking, a soreness. No skin changes or numbness.  8/1: Patient returns with pain on the anterior posterior aspect of his left knee. He states he is done well but then had surgery on his right foot causing him to be in a postop shoe for about 8 to 9 weeks. Feels like when he was doing this he was overcompensating putting more pressure on his left leg. His left knee now feels like it wants to give way especially by the end of the day. His pain is a 3 out of 10 and more achy, also worse at the end of the day and with a lot of standing or walking. He is icing at night which helps.  He is also tried blue emu and occasional Advil. No new injuries. No skin changes or numbness.  11/01/18: Patient reports he's struggled with pain in anterior left knee since last visit. Injection only helped for about 5 days. Problems with going up and down stairs with a sharp pain more medially. Pain currently 0/10. Wearing brace and doing home exercises, going to gym. Feels like a needle sensation medial knee. Not taking any medicine for this. Also with pain in his left arch since Saturday - no acute trauma or injury. No skin changes, numbness.  6/10: Patient comes in today to start viscosupplementation. Pain worse with stairs in left knee. Mild swelling.    6/17: Patient reports he's doing well. Some pain in lateral right elbow at nighttime just distal to lateral aspect. Not worse with any particular  motions. Here for second orthovisc injection - maybe mild improvement with first injection. No skin changes.  History reviewed. No pertinent past medical history.  Current Outpatient Medications on File Prior to Visit  Medication Sig Dispense Refill  . aspirin 81 MG chewable tablet Chew 81 mg by mouth every morning.    . ciprofloxacin (CIPRO) 250 MG tablet Take 250 mg by mouth See admin instructions. Started 03/28/2012. Take 1 tablet by mouth  twice a day for 5 days then 1 tablet daily until procedure date    . Misc Natural Products (PROSTATE HEALTH PO) Take 1 tablet by mouth every morning.    Marland Kitchen OVER THE COUNTER MEDICATION Take by mouth daily. [employer]=Multivitamin Pack    . Red Yeast Rice 600 MG TABS Take 600 mg by mouth every morning.    . saw palmetto 160 MG capsule Take 160 mg by mouth every morning.     Current Facility-Administered Medications on File Prior to Visit  Medication Dose Route Frequency Provider Last Rate Last Dose  . methylPREDNISolone acetate (DEPO-MEDROL) injection 40 mg  40 mg Intra-articular Once Hudnall, Sharyn Lull, MD        History reviewed. No pertinent surgical history.  No Known Allergies  Social History   Socioeconomic History  . Marital status: Married    Spouse name: Not on file  . Number of children: Not on file  . Years  of education: Not on file  . Highest education level: Not on file  Occupational History  . Not on file  Social Needs  . Financial resource strain: Not on file  . Food insecurity    Worry: Not on file    Inability: Not on file  . Transportation needs    Medical: Not on file    Non-medical: Not on file  Tobacco Use  . Smoking status: Never Smoker  . Smokeless tobacco: Never Used  Substance and Sexual Activity  . Alcohol use: Not on file  . Drug use: Not on file  . Sexual activity: Not on file  Lifestyle  . Physical activity    Days per week: Not on file    Minutes per session: Not on file  . Stress: Not on file   Relationships  . Social Herbalist on phone: Not on file    Gets together: Not on file    Attends religious service: Not on file    Active member of club or organization: Not on file    Attends meetings of clubs or organizations: Not on file    Relationship status: Not on file  . Intimate partner violence    Fear of current or ex partner: Not on file    Emotionally abused: Not on file    Physically abused: Not on file    Forced sexual activity: Not on file  Other Topics Concern  . Not on file  Social History Narrative  . Not on file    History reviewed. No pertinent family history.  BP (!) 144/80   Review of Systems: See HPI above.     Objective:  Physical Exam:  Gen: NAD, comfortable in exam room  Rest of exam not repeated today.  Left knee: Minimal effusion.  No other gross deformity, ecchymoses. No TTP. FROM with 5/5 strength flexion and extension. Negative ant/post drawers. Negative valgus/varus testing. Negative lachmans. Negative mcmurrays, apleys, patellar apprehension.  Positive thessalys. NV intact distally.  Left foot/ankle: Overpronation.  No gross deformity, swelling, ecchymoses FROM with 5/5 strength all directions. TTP in body of plantar fascia.  No other tenderness. Negative ant drawer and talar tilt.   Negative syndesmotic compression. Thompsons test negative. NV intact distally.   Assessment & Plan:  1.  Left knee pain - 2/2 combination of arthritis, degenerative OCD, medial meniscus tear.  Second orthovisc injection given today.  F/u in 1 week for third injection.  After informed written consent timeout was performed, patient was lying supine on exam table. Left knee was prepped with alcohol swab and utilizing superolateral approach with ultrasound guidance, patient's left knee was injected intraarticularly with 30mL bupivicaine followed by orthovisc. Patient tolerated the procedure well without immediate complications.

## 2019-03-06 ENCOUNTER — Other Ambulatory Visit: Payer: Self-pay

## 2019-03-06 ENCOUNTER — Ambulatory Visit (INDEPENDENT_AMBULATORY_CARE_PROVIDER_SITE_OTHER): Payer: Medicare Other | Admitting: Family Medicine

## 2019-03-06 DIAGNOSIS — M1712 Unilateral primary osteoarthritis, left knee: Secondary | ICD-10-CM

## 2019-03-07 ENCOUNTER — Encounter: Payer: Self-pay | Admitting: Family Medicine

## 2019-03-07 NOTE — Progress Notes (Signed)
PCP: Hulan Fess, MD  Subjective:   HPI: Patient is a 78 y.o. male here for left knee pain.  3/7: Patient reports that he was at the beach about 5-6 days ago taking down some wallpaper. He recalls having to squat, work on his knees, and thinks that turning his knees may have aggravated this. No acute injury. He noticed a lot of swelling and some pain anteriorly. He took some Advil last night and feels like this is made a big difference. No prior issues with his left knee. Pain was worse with walking, a soreness. No skin changes or numbness.  8/1: Patient returns with pain on the anterior posterior aspect of his left knee. He states he is done well but then had surgery on his right foot causing him to be in a postop shoe for about 8 to 9 weeks. Feels like when he was doing this he was overcompensating putting more pressure on his left leg. His left knee now feels like it wants to give way especially by the end of the day. His pain is a 3 out of 10 and more achy, also worse at the end of the day and with a lot of standing or walking. He is icing at night which helps.  He is also tried blue emu and occasional Advil. No new injuries. No skin changes or numbness.  11/01/18: Patient reports he's struggled with pain in anterior left knee since last visit. Injection only helped for about 5 days. Problems with going up and down stairs with a sharp pain more medially. Pain currently 0/10. Wearing brace and doing home exercises, going to gym. Feels like a needle sensation medial knee. Not taking any medicine for this. Also with pain in his left arch since Saturday - no acute trauma or injury. No skin changes, numbness.  6/10: Patient comes in today to start viscosupplementation. Pain worse with stairs in left knee. Mild swelling.    6/17: Patient reports he's doing well. Some pain in lateral right elbow at nighttime just distal to lateral aspect. Not worse with any particular  motions. Here for second orthovisc injection - maybe mild improvement with first injection. No skin changes.  6/23: Patient reports his knee has improved compared to last visit. Here for third orthovisc injection. No skin changes.  History reviewed. No pertinent past medical history.  Current Outpatient Medications on File Prior to Visit  Medication Sig Dispense Refill  . aspirin 81 MG chewable tablet Chew 81 mg by mouth every morning.    . ciprofloxacin (CIPRO) 250 MG tablet Take 250 mg by mouth See admin instructions. Started 03/28/2012. Take 1 tablet by mouth  twice a day for 5 days then 1 tablet daily until procedure date    . Misc Natural Products (PROSTATE HEALTH PO) Take 1 tablet by mouth every morning.    Marland Kitchen OVER THE COUNTER MEDICATION Take by mouth daily. [employer]=Multivitamin Pack    . Red Yeast Rice 600 MG TABS Take 600 mg by mouth every morning.    . saw palmetto 160 MG capsule Take 160 mg by mouth every morning.     Current Facility-Administered Medications on File Prior to Visit  Medication Dose Route Frequency Provider Last Rate Last Dose  . methylPREDNISolone acetate (DEPO-MEDROL) injection 40 mg  40 mg Intra-articular Once Hudnall, Sharyn Lull, MD        History reviewed. No pertinent surgical history.  No Known Allergies  Social History   Socioeconomic History  . Marital status:  Married    Spouse name: Not on file  . Number of children: Not on file  . Years of education: Not on file  . Highest education level: Not on file  Occupational History  . Not on file  Social Needs  . Financial resource strain: Not on file  . Food insecurity    Worry: Not on file    Inability: Not on file  . Transportation needs    Medical: Not on file    Non-medical: Not on file  Tobacco Use  . Smoking status: Never Smoker  . Smokeless tobacco: Never Used  Substance and Sexual Activity  . Alcohol use: Not on file  . Drug use: Not on file  . Sexual activity: Not on file  Lifestyle   . Physical activity    Days per week: Not on file    Minutes per session: Not on file  . Stress: Not on file  Relationships  . Social Herbalist on phone: Not on file    Gets together: Not on file    Attends religious service: Not on file    Active member of club or organization: Not on file    Attends meetings of clubs or organizations: Not on file    Relationship status: Not on file  . Intimate partner violence    Fear of current or ex partner: Not on file    Emotionally abused: Not on file    Physically abused: Not on file    Forced sexual activity: Not on file  Other Topics Concern  . Not on file  Social History Narrative  . Not on file    History reviewed. No pertinent family history.  BP 124/74   Review of Systems: See HPI above.     Objective:  Physical Exam:  Gen: NAD, comfortable in exam room  Rest of exam not repeated today.  Left knee: Minimal effusion.  No other gross deformity, ecchymoses. No TTP. FROM with 5/5 strength flexion and extension. Negative ant/post drawers. Negative valgus/varus testing. Negative lachmans. Negative mcmurrays, apleys, patellar apprehension.  Positive thessalys. NV intact distally.  Left foot/ankle: Overpronation.  No gross deformity, swelling, ecchymoses FROM with 5/5 strength all directions. TTP in body of plantar fascia.  No other tenderness. Negative ant drawer and talar tilt.   Negative syndesmotic compression. Thompsons test negative. NV intact distally.   Assessment & Plan:  1.  Left knee pain - 2/2 combination arthritis, degenerative OCD, medial meniscus tear.  Third orthovisc injection given today.  Let us know how he's doing over next 4 weeks, can consider fourth injection.  After informed written consent timeout was performed.  Patient was lying supine on exam table.  Left knee was prepped with alcohol swab and utilizing superolateral approach with ultrasound guidance, patient's left knee was  injected intraarticularly with 17mL bupivicaine followed by orthovisc.  Patient tolerated procedure well without immediate complications.

## 2019-03-26 ENCOUNTER — Encounter: Payer: Self-pay | Admitting: Family Medicine

## 2019-03-26 ENCOUNTER — Ambulatory Visit (INDEPENDENT_AMBULATORY_CARE_PROVIDER_SITE_OTHER): Payer: Medicare Other | Admitting: Family Medicine

## 2019-03-26 ENCOUNTER — Other Ambulatory Visit: Payer: Self-pay

## 2019-03-26 VITALS — BP 140/64 | Ht 70.0 in | Wt 165.0 lb

## 2019-03-26 DIAGNOSIS — M25562 Pain in left knee: Secondary | ICD-10-CM | POA: Diagnosis not present

## 2019-03-26 DIAGNOSIS — M1712 Unilateral primary osteoarthritis, left knee: Secondary | ICD-10-CM

## 2019-03-26 NOTE — Progress Notes (Signed)
PCP: Hulan Fess, MD  Subjective:   HPI: Patient is a 78 y.o. male here for left knee pain.  3/7: Patient reports that he was at the beach about 5-6 days ago taking down some wallpaper. He recalls having to squat, work on his knees, and thinks that turning his knees may have aggravated this. No acute injury. He noticed a lot of swelling and some pain anteriorly. He took some Advil last night and feels like this is made a big difference. No prior issues with his left knee. Pain was worse with walking, a soreness. No skin changes or numbness.  8/1: Patient returns with pain on the anterior posterior aspect of his left knee. He states he is done well but then had surgery on his right foot causing him to be in a postop shoe for about 8 to 9 weeks. Feels like when he was doing this he was overcompensating putting more pressure on his left leg. His left knee now feels like it wants to give way especially by the end of the day. His pain is a 3 out of 10 and more achy, also worse at the end of the day and with a lot of standing or walking. He is icing at night which helps.  He is also tried blue emu and occasional Advil. No new injuries. No skin changes or numbness.  11/01/18: Patient reports he's struggled with pain in anterior left knee since last visit. Injection only helped for about 5 days. Problems with going up and down stairs with a sharp pain more medially. Pain currently 0/10. Wearing brace and doing home exercises, going to gym. Feels like a needle sensation medial knee. Not taking any medicine for this. Also with pain in his left arch since Saturday - no acute trauma or injury. No skin changes, numbness.  6/10: Patient comes in today to start viscosupplementation. Pain worse with stairs in left knee. Mild swelling.    6/17: Patient reports he's doing well. Some pain in lateral right elbow at nighttime just distal to lateral aspect. Not worse with any particular  motions. Here for second orthovisc injection - maybe mild improvement with first injection. No skin changes.  6/23: Patient reports his knee has improved compared to last visit. Here for third orthovisc injection. No skin changes.  7/13: Patient reports he was doing some work while kneeling last week and had increase in swelling of his left knee but not a lot of pain. Swelling has improved some since then but was curious if he should go ahead with fourth orthovisc injection. No skin changes.  History reviewed. No pertinent past medical history.  Current Outpatient Medications on File Prior to Visit  Medication Sig Dispense Refill  . aspirin 81 MG chewable tablet Chew 81 mg by mouth every morning.    . ciprofloxacin (CIPRO) 250 MG tablet Take 250 mg by mouth See admin instructions. Started 03/28/2012. Take 1 tablet by mouth  twice a day for 5 days then 1 tablet daily until procedure date    . Misc Natural Products (PROSTATE HEALTH PO) Take 1 tablet by mouth every morning.    Marland Kitchen OVER THE COUNTER MEDICATION Take by mouth daily. [employer]=Multivitamin Pack    . Red Yeast Rice 600 MG TABS Take 600 mg by mouth every morning.    . saw palmetto 160 MG capsule Take 160 mg by mouth every morning.     Current Facility-Administered Medications on File Prior to Visit  Medication Dose Route Frequency Provider  Last Rate Last Dose  . methylPREDNISolone acetate (DEPO-MEDROL) injection 40 mg  40 mg Intra-articular Once Hudnall, Sharyn Lull, MD        History reviewed. No pertinent surgical history.  No Known Allergies  Social History   Socioeconomic History  . Marital status: Married    Spouse name: Not on file  . Number of children: Not on file  . Years of education: Not on file  . Highest education level: Not on file  Occupational History  . Not on file  Social Needs  . Financial resource strain: Not on file  . Food insecurity    Worry: Not on file    Inability: Not on file  . Transportation  needs    Medical: Not on file    Non-medical: Not on file  Tobacco Use  . Smoking status: Never Smoker  . Smokeless tobacco: Never Used  Substance and Sexual Activity  . Alcohol use: Not on file  . Drug use: Not on file  . Sexual activity: Not on file  Lifestyle  . Physical activity    Days per week: Not on file    Minutes per session: Not on file  . Stress: Not on file  Relationships  . Social Herbalist on phone: Not on file    Gets together: Not on file    Attends religious service: Not on file    Active member of club or organization: Not on file    Attends meetings of clubs or organizations: Not on file    Relationship status: Not on file  . Intimate partner violence    Fear of current or ex partner: Not on file    Emotionally abused: Not on file    Physically abused: Not on file    Forced sexual activity: Not on file  Other Topics Concern  . Not on file  Social History Narrative  . Not on file    History reviewed. No pertinent family history.  BP 140/64   Ht 5\' 10"  (1.778 m)   Wt 165 lb (74.8 kg)   BMI 23.68 kg/m   Review of Systems: See HPI above.     Objective:  Physical Exam:  Gen: NAD, comfortable in exam room  Left knee: Mod effusion.  No other gross deformity, ecchymoses. No TTP currently. ROM 0 - 120 degrees with 5/5 strength flexion and extension. Negative ant/post drawers. Negative valgus/varus testing. Negative lachmans. NV intact distally.   Assessment & Plan:  1.  Left knee pain - 2/2 combination arthritis, degenerative OCD, medial meniscus tear.  S/p 3 orthovisc injections.  Only pain is with stairs and is tolerable despite his swelling.  Will continue to monitor, consider 4th orthovisc, ortho referral depending on how he does moving forward.  F/u prn.

## 2019-06-26 DIAGNOSIS — D485 Neoplasm of uncertain behavior of skin: Secondary | ICD-10-CM | POA: Diagnosis not present

## 2019-06-26 DIAGNOSIS — Z86018 Personal history of other benign neoplasm: Secondary | ICD-10-CM | POA: Diagnosis not present

## 2019-06-26 DIAGNOSIS — D2272 Melanocytic nevi of left lower limb, including hip: Secondary | ICD-10-CM | POA: Diagnosis not present

## 2019-06-26 DIAGNOSIS — Z23 Encounter for immunization: Secondary | ICD-10-CM | POA: Diagnosis not present

## 2019-06-26 DIAGNOSIS — L570 Actinic keratosis: Secondary | ICD-10-CM | POA: Diagnosis not present

## 2019-06-26 DIAGNOSIS — L820 Inflamed seborrheic keratosis: Secondary | ICD-10-CM | POA: Diagnosis not present

## 2019-06-26 DIAGNOSIS — Z85820 Personal history of malignant melanoma of skin: Secondary | ICD-10-CM | POA: Diagnosis not present

## 2019-06-26 DIAGNOSIS — D225 Melanocytic nevi of trunk: Secondary | ICD-10-CM | POA: Diagnosis not present

## 2019-06-26 DIAGNOSIS — L821 Other seborrheic keratosis: Secondary | ICD-10-CM | POA: Diagnosis not present

## 2019-06-26 DIAGNOSIS — L814 Other melanin hyperpigmentation: Secondary | ICD-10-CM | POA: Diagnosis not present

## 2019-08-20 ENCOUNTER — Ambulatory Visit (INDEPENDENT_AMBULATORY_CARE_PROVIDER_SITE_OTHER): Payer: Medicare Other | Admitting: Family Medicine

## 2019-08-20 ENCOUNTER — Other Ambulatory Visit: Payer: Self-pay

## 2019-08-20 DIAGNOSIS — M1712 Unilateral primary osteoarthritis, left knee: Secondary | ICD-10-CM

## 2019-08-21 ENCOUNTER — Encounter: Payer: Self-pay | Admitting: Family Medicine

## 2019-08-21 NOTE — Progress Notes (Signed)
PCP: Hulan Fess, MD  Subjective:   HPI: Patient is a 78 y.o. male here for left knee pain.  3/7: Patient reports that he was at the beach about [place]-6 days ago taking down some wallpaper. He recalls having to squat, work on his knees, and thinks that turning his knees may have aggravated this. No acute injury. He noticed a lot of swelling and some pain anteriorly. He took some Advil last night and feels like this is made a big difference. No prior issues with his left knee. Pain was worse with walking, a soreness. No skin changes or numbness.  8/1: Patient returns with pain on the anterior posterior aspect of his left knee. He states he is done well but then had surgery on his right foot causing him to be in a postop shoe for about 8 to 9 weeks. Feels like when he was doing this he was overcompensating putting more pressure on his left leg. His left knee now feels like it wants to give way especially by the end of the day. His pain is a 3 out of 10 and more achy, also worse at the end of the day and with a lot of standing or walking. He is icing at night which helps.  He is also tried blue emu and occasional Advil. No new injuries. No skin changes or numbness.  11/01/18: Patient reports he's struggled with pain in anterior left knee since last visit. Injection only helped for about 5 days. Problems with going up and down stairs with a sharp pain more medially. Pain currently 0/10. Wearing brace and doing home exercises, going to gym. Feels like a needle sensation medial knee. Not taking any medicine for this. Also with pain in his left arch since Saturday - no acute trauma or injury. No skin changes, numbness.  6/10: Patient comes in today to start viscosupplementation. Pain worse with stairs in left knee. Mild swelling.    6/17: Patient reports he's doing well. Some pain in lateral right elbow at nighttime just distal to lateral aspect. Not worse with any particular  motions. Here for second orthovisc injection - maybe mild improvement with first injection. No skin changes.  6/23: Patient reports his knee has improved compared to last visit. Here for third orthovisc injection. No skin changes.  7/13: Patient reports he was doing some work while kneeling last week and had increase in swelling of his left knee but not a lot of pain. Swelling has improved some since then but was curious if he should go ahead with fourth orthovisc injection. No skin changes.  12/7: Patient returns as over the past couple weeks his left knee pain has returned. Pain is more of a soreness. Interested in fourth orthovisc injection He also had some right calf pain for a couple days within the last week but it completely resolved. Swelling has improved.  History reviewed. No pertinent past medical history.  Current Outpatient Medications on File Prior to Visit  Medication Sig Dispense Refill  . aspirin 81 MG chewable tablet Chew 81 mg by mouth every morning.    . ciprofloxacin (CIPRO) 250 MG tablet Take 250 mg by mouth See admin instructions. Started 03/28/2012. Take 1 tablet by mouth  twice a day for 5 days then 1 tablet daily until procedure date    . Misc Natural Products (PROSTATE HEALTH PO) Take 1 tablet by mouth every morning.    Marland Kitchen OVER THE COUNTER MEDICATION Take by mouth daily. [employer]=Multivitamin Pack    .  Red Yeast Rice 600 MG TABS Take 600 mg by mouth every morning.    . saw palmetto 160 MG capsule Take 160 mg by mouth every morning.     Current Facility-Administered Medications on File Prior to Visit  Medication Dose Route Frequency Provider Last Rate Last Dose  . methylPREDNISolone acetate (DEPO-MEDROL) injection 40 mg  40 mg Intra-articular Once , Sharyn Lull, MD        History reviewed. No pertinent surgical history.  No Known Allergies  Social History   Socioeconomic History  . Marital status: Married    Spouse name: Not on file  . Number of  children: Not on file  . Years of education: Not on file  . Highest education level: Not on file  Occupational History  . Not on file  Social Needs  . Financial resource strain: Not on file  . Food insecurity    Worry: Not on file    Inability: Not on file  . Transportation needs    Medical: Not on file    Non-medical: Not on file  Tobacco Use  . Smoking status: Never Smoker  . Smokeless tobacco: Never Used  Substance and Sexual Activity  . Alcohol use: Not on file  . Drug use: Not on file  . Sexual activity: Not on file  Lifestyle  . Physical activity    Days per week: Not on file    Minutes per session: Not on file  . Stress: Not on file  Relationships  . Social Herbalist on phone: Not on file    Gets together: Not on file    Attends religious service: Not on file    Active member of club or organization: Not on file    Attends meetings of clubs or organizations: Not on file    Relationship status: Not on file  . Intimate partner violence    Fear of current or ex partner: Not on file    Emotionally abused: Not on file    Physically abused: Not on file    Forced sexual activity: Not on file  Other Topics Concern  . Not on file  Social History Narrative  . Not on file    History reviewed. No pertinent family history.  BP (!) 160/80   Ht 5\' 10"  (1.778 m)   Wt 165 lb (74.8 kg)   BMI 23.68 kg/m   Review of Systems: See HPI above.     Objective:  Physical Exam:  Gen: NAD, comfortable in exam room  Left knee: Mild effusion. Rest of exam not repeated today.   Assessment & Plan:  1.  Left knee pain - 2/2 arthritis, degenerative OCD, medial meniscus tear.  Fourth orthovisc injection given today.  Previously discussed tylenol, topical medications, supplements.  Can repeat series every 6 months.  Consider ortho referral if he doesn't improve as expected.  After informed written consent timeout was performed, patient was lying supine on exam table.  Left knee was prepped with alcohol swab and utilizing superolateral approach with ultrasound guidance, patient's left knee was injected intraarticularly with 95mL bupivicaine followed by orthovisc. Patient tolerated the procedure well without immediate complications.

## 2019-10-03 ENCOUNTER — Ambulatory Visit: Payer: Medicare Other | Attending: Internal Medicine

## 2019-10-03 DIAGNOSIS — Z23 Encounter for immunization: Secondary | ICD-10-CM | POA: Diagnosis not present

## 2019-10-03 NOTE — Progress Notes (Signed)
   Covid-19 Vaccination Clinic  Name:  Randy Bradshaw    MRN: [id number] DOB: 03/30/1941  10/03/2019  Mr. [name] was observed post Covid-19 immunization for 15 minutes without incidence. He was provided with Vaccine Information Sheet and instruction to access the V-Safe system.   [name] was instructed to call 911 with any severe reactions post vaccine: Marland Kitchen Difficulty breathing  . Swelling of your face and throat  . A fast heartbeat  . A bad rash all over your body  . Dizziness and weakness    Immunizations Administered    Name Date Dose VIS Date Route   Pfizer COVID-19 Vaccine 10/03/2019  5:41 PM 0.3 mL 08/24/2019 Intramuscular   Manufacturer: [place]   Lot: [id number]   [place]: [id number]

## 2019-10-24 ENCOUNTER — Ambulatory Visit: Payer: Medicare Other | Attending: Internal Medicine

## 2019-10-24 DIAGNOSIS — Z23 Encounter for immunization: Secondary | ICD-10-CM

## 2019-10-24 NOTE — Progress Notes (Signed)
   Covid-19 Vaccination Clinic  Name:  Randy Bradshaw    MRN: [id number] DOB: [birth year]  10/24/2019  [name] was observed post Covid-19 immunization for 15 minutes without incidence. He was provided with Vaccine Information Sheet and instruction to access the V-Safe system.   [name] was instructed to call 911 with any severe reactions post vaccine: Marland Kitchen Difficulty breathing  . Swelling of your face and throat  . A fast heartbeat  . A bad rash all over your body  . Dizziness and weakness    Immunizations Administered    Name Date Dose VIS Date Route   Pfizer COVID-19 Vaccine 10/24/2019  2:27 PM 0.3 mL 08/24/2019 Intramuscular   Manufacturer: [place]   Lot: [id number]   [place]: [id number]

## 2019-10-30 ENCOUNTER — Ambulatory Visit: Payer: Medicare Other

## 2019-11-07 ENCOUNTER — Other Ambulatory Visit: Payer: Self-pay

## 2019-11-07 ENCOUNTER — Encounter: Payer: Self-pay | Admitting: Family Medicine

## 2019-11-07 ENCOUNTER — Ambulatory Visit (INDEPENDENT_AMBULATORY_CARE_PROVIDER_SITE_OTHER): Payer: Medicare Other | Admitting: Family Medicine

## 2019-11-07 DIAGNOSIS — M1712 Unilateral primary osteoarthritis, left knee: Secondary | ICD-10-CM | POA: Diagnosis not present

## 2019-11-07 NOTE — Progress Notes (Signed)
PCP: Hulan Fess, MD  Subjective:   HPI: Patient is a 79 y.o. male here for left knee pain.  3/7: Patient reports that he was at the beach about 5-6 days ago taking down some wallpaper. He recalls having to squat, work on his knees, and thinks that turning his knees may have aggravated this. No acute injury. He noticed a lot of swelling and some pain anteriorly. He took some Advil last night and feels like this is made a big difference. No prior issues with his left knee. Pain was worse with walking, a soreness. No skin changes or numbness.  8/1: Patient returns with pain on the anterior posterior aspect of his left knee. He states he is done well but then had surgery on his right foot causing him to be in a postop shoe for about 8 to 9 weeks. Feels like when he was doing this he was overcompensating putting more pressure on his left leg. His left knee now feels like it wants to give way especially by the end of the day. His pain is a 3 out of 10 and more achy, also worse at the end of the day and with a lot of standing or walking. He is icing at night which helps.  He is also tried blue emu and occasional Advil. No new injuries. No skin changes or numbness.  11/01/18: Patient reports he's struggled with pain in anterior left knee since last visit. Injection only helped for about 5 days. Problems with going up and down stairs with a sharp pain more medially. Pain currently 0/10. Wearing brace and doing home exercises, going to gym. Feels like a needle sensation medial knee. Not taking any medicine for this. Also with pain in his left arch since Saturday - no acute trauma or injury. No skin changes, numbness.  6/10: Patient comes in today to start viscosupplementation. Pain worse with stairs in left knee. Mild swelling.    6/17: Patient reports he's doing well. Some pain in lateral right elbow at nighttime just distal to lateral aspect. Not worse with any particular  motions. Here for second orthovisc injection - maybe mild improvement with first injection. No skin changes.  6/23: Patient reports his knee has improved compared to last visit. Here for third orthovisc injection. No skin changes.  7/13: Patient reports he was doing some work while kneeling last week and had increase in swelling of his left knee but not a lot of pain. Swelling has improved some since then but was curious if he should go ahead with fourth orthovisc injection. No skin changes.  12/7: Patient returns as over the past couple weeks his left knee pain has returned. Pain is more of a soreness. Interested in fourth orthovisc injection He also had some right calf pain for a couple days within the last week but it completely resolved. Swelling has improved.  2/24: Patient returns for orthovisc series. He's going to [place] in a few weeks and wanted to come in to do series prior to this. Pain anterior left knee with mild swelling. No new injuries.  History reviewed. No pertinent past medical history.  Current Outpatient Medications on File Prior to Visit  Medication Sig Dispense Refill  . aspirin 81 MG chewable tablet Chew 81 mg by mouth every morning.    . ciprofloxacin (CIPRO) 250 MG tablet Take 250 mg by mouth See admin instructions. Started 03/28/2012. Take 1 tablet by mouth  twice a day for 5 days then 1 tablet daily  until procedure date    . Misc Natural Products (PROSTATE HEALTH PO) Take 1 tablet by mouth every morning.    Marland Kitchen OVER THE COUNTER MEDICATION Take by mouth daily. [employer]=Multivitamin Pack    . Red Yeast Rice 600 MG TABS Take 600 mg by mouth every morning.    . saw palmetto 160 MG capsule Take 160 mg by mouth every morning.     Current Facility-Administered Medications on File Prior to Visit  Medication Dose Route Frequency Provider Last Rate Last Admin  . methylPREDNISolone acetate (DEPO-MEDROL) injection 40 mg  40 mg Intra-articular Once Zaydin Billey, Sharyn Lull, MD         History reviewed. No pertinent surgical history.  No Known Allergies  Social History   Socioeconomic History  . Marital status: Married    Spouse name: Not on file  . Number of children: Not on file  . Years of education: Not on file  . Highest education level: Not on file  Occupational History  . Not on file  Tobacco Use  . Smoking status: Never Smoker  . Smokeless tobacco: Never Used  Substance and Sexual Activity  . Alcohol use: Not on file  . Drug use: Not on file  . Sexual activity: Not on file  Other Topics Concern  . Not on file  Social History Narrative  . Not on file   Social Determinants of Health   Financial Resource Strain:   . Difficulty of Paying Living Expenses: Not on file  Food Insecurity:   . Worried About Charity fundraiser in the Last Year: Not on file  . Ran Out of Food in the Last Year: Not on file  Transportation Needs:   . Lack of Transportation (Medical): Not on file  . Lack of Transportation (Non-Medical): Not on file  Physical Activity:   . Days of Exercise per Week: Not on file  . Minutes of Exercise per Session: Not on file  Stress:   . Feeling of Stress : Not on file  Social Connections:   . Frequency of Communication with Friends and Family: Not on file  . Frequency of Social Gatherings with Friends and Family: Not on file  . Attends Religious Services: Not on file  . Active Member of Clubs or Organizations: Not on file  . Attends Archivist Meetings: Not on file  . Marital Status: Not on file  Intimate Partner Violence:   . Fear of Current or Ex-Partner: Not on file  . Emotionally Abused: Not on file  . Physically Abused: Not on file  . Sexually Abused: Not on file    History reviewed. No pertinent family history.  BP (!) 166/70   Ht 5\' 10"  (1.778 m)   Wt 165 lb (74.8 kg)   BMI 23.68 kg/m   Review of Systems: See HPI above.     Objective:  Physical Exam:  Gen: NAD, comfortable in exam  room  Left knee: Mild effusion. Rest of exam not repeated today.   Assessment & Plan:  1.  Left knee pain - 2/2 arthritis, degenerative OCD, medial meniscus tear.  orthovisc series started today - did well with this in the past.  Previously discussed tylenol, topical medications, supplements.  Consider ortho referral if he doesn't improve.  After informed written consent timeout was performed, patient was lying supine on exam table. Left knee was prepped with alcohol swab and utilizing superolateral approach with ultrasound guidance, patient's left knee was injected intraarticularly with 39mL bupivicaine  followed by orthovisc. Patient tolerated the procedure well without immediate complications.

## 2019-11-14 ENCOUNTER — Ambulatory Visit (INDEPENDENT_AMBULATORY_CARE_PROVIDER_SITE_OTHER): Payer: Medicare Other | Admitting: Family Medicine

## 2019-11-14 ENCOUNTER — Encounter: Payer: Self-pay | Admitting: Family Medicine

## 2019-11-14 ENCOUNTER — Other Ambulatory Visit: Payer: Self-pay

## 2019-11-14 DIAGNOSIS — M1712 Unilateral primary osteoarthritis, left knee: Secondary | ICD-10-CM | POA: Diagnosis not present

## 2019-11-14 NOTE — Progress Notes (Signed)
PCP: Hulan Fess, MD  Subjective:   HPI: Patient is a 79 y.o. male here for left knee pain.  3/7: Patient reports that he was at the beach about 5-6 days ago taking down some wallpaper. He recalls having to squat, work on his knees, and thinks that turning his knees may have aggravated this. No acute injury. He noticed a lot of swelling and some pain anteriorly. He took some Advil last night and feels like this is made a big difference. No prior issues with his left knee. Pain was worse with walking, a soreness. No skin changes or numbness.  8/1: Patient returns with pain on the anterior posterior aspect of his left knee. He states he is done well but then had surgery on his right foot causing him to be in a postop shoe for about 8 to 9 weeks. Feels like when he was doing this he was overcompensating putting more pressure on his left leg. His left knee now feels like it wants to give way especially by the end of the day. His pain is a 3 out of 10 and more achy, also worse at the end of the day and with a lot of standing or walking. He is icing at night which helps.  He is also tried blue emu and occasional Advil. No new injuries. No skin changes or numbness.  11/01/18: Patient reports he's struggled with pain in anterior left knee since last visit. Injection only helped for about 5 days. Problems with going up and down stairs with a sharp pain more medially. Pain currently 0/10. Wearing brace and doing home exercises, going to gym. Feels like a needle sensation medial knee. Not taking any medicine for this. Also with pain in his left arch since Saturday - no acute trauma or injury. No skin changes, numbness.  6/10: Patient comes in today to start viscosupplementation. Pain worse with stairs in left knee. Mild swelling.    6/17: Patient reports he's doing well. Some pain in lateral right elbow at nighttime just distal to lateral aspect. Not worse with any particular  motions. Here for second orthovisc injection - maybe mild improvement with first injection. No skin changes.  6/23: Patient reports his knee has improved compared to last visit. Here for third orthovisc injection. No skin changes.  7/13: Patient reports he was doing some work while kneeling last week and had increase in swelling of his left knee but not a lot of pain. Swelling has improved some since then but was curious if he should go ahead with fourth orthovisc injection. No skin changes.  12/7: Patient returns as over the past couple weeks his left knee pain has returned. Pain is more of a soreness. Interested in fourth orthovisc injection He also had some right calf pain for a couple days within the last week but it completely resolved. Swelling has improved.  2/24: Patient returns for orthovisc series. He's going to [place] in a few weeks and wanted to come in to do series prior to this. Pain anterior left knee with mild swelling. No new injuries.  3/3: Patient reports he's doing well. No new complaints.  History reviewed. No pertinent past medical history.  Current Outpatient Medications on File Prior to Visit  Medication Sig Dispense Refill  . aspirin 81 MG chewable tablet Chew 81 mg by mouth every morning.    . ciprofloxacin (CIPRO) 250 MG tablet Take 250 mg by mouth See admin instructions. Started 03/28/2012. Take 1 tablet by mouth  twice a day for 5 days then 1 tablet daily until procedure date    . Misc Natural Products (PROSTATE HEALTH PO) Take 1 tablet by mouth every morning.    Marland Kitchen OVER THE COUNTER MEDICATION Take by mouth daily. [employer]=Multivitamin Pack    . Red Yeast Rice 600 MG TABS Take 600 mg by mouth every morning.    . saw palmetto 160 MG capsule Take 160 mg by mouth every morning.     Current Facility-Administered Medications on File Prior to Visit  Medication Dose Route Frequency Provider Last Rate Last Admin  . methylPREDNISolone acetate (DEPO-MEDROL)  injection 40 mg  40 mg Intra-articular Once K[place] No, Sharyn Lull, MD        History reviewed. No pertinent surgical history.  No Known Allergies  Social History   Socioeconomic History  . Marital status: Married    Spouse name: Not on file  . Number of children: Not on file  . Years of education: Not on file  . Highest education level: Not on file  Occupational History  . Not on file  Tobacco Use  . Smoking status: Never Smoker  . Smokeless tobacco: Never Used  Substance and Sexual Activity  . Alcohol use: Not on file  . Drug use: Not on file  . Sexual activity: Not on file  Other Topics Concern  . Not on file  Social History Narrative  . Not on file   Social Determinants of Health   Financial Resource Strain:   . Difficulty of Paying Living Expenses: Not on file  Food Insecurity:   . Worried About Charity fundraiser in the Last Year: Not on file  . Ran Out of Food in the Last Year: Not on file  Transportation Needs:   . Lack of Transportation (Medical): Not on file  . Lack of Transportation (Non-Medical): Not on file  Physical Activity:   . Days of Exercise per Week: Not on file  . Minutes of Exercise per Session: Not on file  Stress:   . Feeling of Stress : Not on file  Social Connections:   . Frequency of Communication with Friends and Family: Not on file  . Frequency of Social Gatherings with Friends and Family: Not on file  . Attends Religious Services: Not on file  . Active Member of Clubs or Organizations: Not on file  . Attends Archivist Meetings: Not on file  . Marital Status: Not on file  Intimate Partner Violence:   . Fear of Current or Ex-Partner: Not on file  . Emotionally Abused: Not on file  . Physically Abused: Not on file  . Sexually Abused: Not on file    History reviewed. No pertinent family history.  BP (!) 150/78   Ht 5\' 10"  (1.778 m)   Wt 165 lb (74.8 kg)   BMI 23.68 kg/m   Review of Systems: See HPI above.      Objective:  Physical Exam:  Gen: NAD, comfortable in exam room  Remainder of exam not repeated today. Left knee: Mild effusion. Rest of exam not repeated today.   Assessment & Plan:  1.  Left knee pain - 2/2 arthritis, degenerative OCD, medial meniscus tear.  Second orthovisc injection given today - done well with this in the past.  Previously discussed tylenol, topical medications, supplements.  Ortho referral if he doesn't improve.  After informed written consent timeout was performed, patient was lying supine on exam table. Left knee was prepped with alcohol swab  and utilizing superolateral approach with ultrasound guidance, patient's left knee was injected intraarticularly with 72mL bupivicaine followed by orthovisc. Patient tolerated the procedure well without immediate complications.

## 2019-11-21 ENCOUNTER — Ambulatory Visit (INDEPENDENT_AMBULATORY_CARE_PROVIDER_SITE_OTHER): Payer: Medicare Other | Admitting: Family Medicine

## 2019-11-21 ENCOUNTER — Encounter: Payer: Self-pay | Admitting: Family Medicine

## 2019-11-21 ENCOUNTER — Other Ambulatory Visit: Payer: Self-pay

## 2019-11-21 DIAGNOSIS — M1712 Unilateral primary osteoarthritis, left knee: Secondary | ICD-10-CM

## 2019-11-21 NOTE — Progress Notes (Signed)
PCP: Hulan Fess, MD  Subjective:   HPI: Patient is a 79 y.o. male here for left knee pain.  3/7: Patient reports that he was at the beach about [place]6 days ago taking down some wallpaper. He recalls having to squat, work on his knees, and thinks that turning his knees may have aggravated this. No acute injury. He noticed a lot of swelling and some pain anteriorly. He took some Advil last night and feels like this is made a big difference. No prior issues with his left knee. Pain was worse with walking, a soreness. No skin changes or numbness.  8/1: Patient returns with pain on the anterior posterior aspect of his left knee. He states he is done well but then had surgery on his right foot causing him to be in a postop shoe for about 8 to 9 weeks. Feels like when he was doing this he was overcompensating putting more pressure on his left leg. His left knee now feels like it wants to give way especially by the end of the day. His pain is a 3 out of 10 and more achy, also worse at the end of the day and with a lot of standing or walking. He is icing at night which helps.  He is also tried blue emu and occasional Advil. No new injuries. No skin changes or numbness.  11/01/18: Patient reports he's struggled with pain in anterior left knee since last visit. Injection only helped for about 5 days. Problems with going up and down stairs with a sharp pain more medially. Pain currently 0/10. Wearing brace and doing home exercises, going to gym. Feels like a needle sensation medial knee. Not taking any medicine for this. Also with pain in his left arch since Saturday - no acute trauma or injury. No skin changes, numbness.  6/10: Patient comes in today to start viscosupplementation. Pain worse with stairs in left knee. Mild swelling.    6/17: Patient reports he's doing well. Some pain in lateral right elbow at nighttime just distal to lateral aspect. Not worse with any particular  motions. Here for second orthovisc injection - maybe mild improvement with first injection. No skin changes.  6/23: Patient reports his knee has improved compared to last visit. Here for third orthovisc injection. No skin changes.  7/13: Patient reports he was doing some work while kneeling last week and had increase in swelling of his left knee but not a lot of pain. Swelling has improved some since then but was curious if he should go ahead with fourth orthovisc injection. No skin changes.  12/7: Patient returns as over the past couple weeks his left knee pain has returned. Pain is more of a soreness. Interested in fourth orthovisc injection He also had some right calf pain for a couple days within the last week but it completely resolved. Swelling has improved.  2/24: Patient returns for orthovisc series. He's going to [place] in a few weeks and wanted to come in to do series prior to this. Pain anterior left knee with mild swelling. No new injuries.  3/3: Patient reports he's doing well. No new complaints.  3/10: Patient doing well without new complaints.  History reviewed. No pertinent past medical history.  Current Outpatient Medications on File Prior to Visit  Medication Sig Dispense Refill  . aspirin 81 MG chewable tablet Chew 81 mg by mouth every morning.    . ciprofloxacin (CIPRO) 250 MG tablet Take 250 mg by mouth See admin instructions.  Started 03/28/2012. Take 1 tablet by mouth  twice a day for 5 days then 1 tablet daily until procedure date    . Misc Natural Products (PROSTATE HEALTH PO) Take 1 tablet by mouth every morning.    Marland Kitchen OVER THE COUNTER MEDICATION Take by mouth daily. [employer]=Multivitamin Pack    . Red Yeast Rice 600 MG TABS Take 600 mg by mouth every morning.    . saw palmetto 160 MG capsule Take 160 mg by mouth every morning.     Current Facility-Administered Medications on File Prior to Visit  Medication Dose Route Frequency Provider Last Rate Last  Admin  . methylPREDNISolone acetate (DEPO-MEDROL) injection 40 mg  40 mg Intra-articular Once Neamiah Sciarra, Sharyn Lull, MD        History reviewed. No pertinent surgical history.  No Known Allergies  Social History   Socioeconomic History  . Marital status: Married    Spouse name: Not on file  . Number of children: Not on file  . Years of education: Not on file  . Highest education level: Not on file  Occupational History  . Not on file  Tobacco Use  . Smoking status: Never Smoker  . Smokeless tobacco: Never Used  Substance and Sexual Activity  . Alcohol use: Not on file  . Drug use: Not on file  . Sexual activity: Not on file  Other Topics Concern  . Not on file  Social History Narrative  . Not on file   Social Determinants of Health   Financial Resource Strain:   . Difficulty of Paying Living Expenses: Not on file  Food Insecurity:   . Worried About Charity fundraiser in the Last Year: Not on file  . Ran Out of Food in the Last Year: Not on file  Transportation Needs:   . Lack of Transportation (Medical): Not on file  . Lack of Transportation (Non-Medical): Not on file  Physical Activity:   . Days of Exercise per Week: Not on file  . Minutes of Exercise per Session: Not on file  Stress:   . Feeling of Stress : Not on file  Social Connections:   . Frequency of Communication with Friends and Family: Not on file  . Frequency of Social Gatherings with Friends and Family: Not on file  . Attends Religious Services: Not on file  . Active Member of Clubs or Organizations: Not on file  . Attends Archivist Meetings: Not on file  . Marital Status: Not on file  Intimate Partner Violence:   . Fear of Current or Ex-Partner: Not on file  . Emotionally Abused: Not on file  . Physically Abused: Not on file  . Sexually Abused: Not on file    History reviewed. No pertinent family history.  BP (!) 152/72   Ht 5\' 10"  (1.778 m)   Wt 165 lb (74.8 kg)   BMI 23.68 kg/m    Review of Systems: See HPI above.     Objective:  Physical Exam:  Gen: NAD, comfortable in exam room  Left knee: Mild effusion. Remainder of exam not repeated today.   Assessment & Plan:  1.  Left knee pain - 2/2 arthritis, degenerative OCD, medial meniscus tear.  Third orthovisc injection given today - done well with this in the past.  Previously discussed tylenol, topical medications, supplements.  Ortho referral if not improving.  After informed written consent timeout was performed, patient was lying supine on exam table. Left knee was prepped with alcohol  swab and utilizing superolateral approach with ultrasound guidance, patient's left knee was injected intraarticularly with 78mL bupivicaine followed by orthovisc. Patient tolerated the procedure well without immediate complications.

## 2020-04-04 ENCOUNTER — Encounter: Payer: Self-pay | Admitting: Family Medicine

## 2020-04-04 ENCOUNTER — Ambulatory Visit (INDEPENDENT_AMBULATORY_CARE_PROVIDER_SITE_OTHER): Payer: Medicare Other | Admitting: Family Medicine

## 2020-04-04 ENCOUNTER — Other Ambulatory Visit: Payer: Self-pay

## 2020-04-04 VITALS — BP 168/70 | Ht 70.0 in | Wt 165.0 lb

## 2020-04-04 DIAGNOSIS — M1711 Unilateral primary osteoarthritis, right knee: Secondary | ICD-10-CM | POA: Diagnosis not present

## 2020-04-04 MED ORDER — METHYLPREDNISOLONE ACETATE 40 MG/ML IJ SUSP
40.0000 mg | Freq: Once | INTRAMUSCULAR | Status: AC
  Administered 2020-04-04: 40 mg via INTRA_ARTICULAR

## 2020-04-04 NOTE — Progress Notes (Signed)
PCP: Hulan Fess, MD  Subjective:   HPI: Patient is a 79 y.o. male here for evaluation of right knee pain.  He has known history of left sided osteoarthritis and medial meniscus tear, currently being treated here with Orthovisc.  He reports that over the last 2 to 3 months, he has noticed worsening right medial knee pain.  Is worse after days when he is walking on the beach.  He describes as an aching pain that does sometimes have some sharp pains if he twists his knee.  He has been using topical Voltaren which helps some.  He has not been taking any oral anti-inflammatories as he is worried it will affect his kidneys.  He reports that he has a distant history about 15 to 20 years ago of a right medial knee injury, he got MRIs at that time which did not show meniscus tear but did show osteoarthritis.  He has not had any further imaging of his knee since then.  No past medical history on file.  Current Outpatient Medications on File Prior to Visit  Medication Sig Dispense Refill  . aspirin 81 MG chewable tablet Chew 81 mg by mouth every morning.    . ciprofloxacin (CIPRO) 250 MG tablet Take 250 mg by mouth See admin instructions. Started 03/28/2012. Take 1 tablet by mouth  twice a day for 5 days then 1 tablet daily until procedure date    . Misc Natural Products (PROSTATE HEALTH PO) Take 1 tablet by mouth every morning.    Marland Kitchen OVER THE COUNTER MEDICATION Take by mouth daily. [employer]=Multivitamin Pack    . Red Yeast Rice 600 MG TABS Take 600 mg by mouth every morning.    . saw palmetto 160 MG capsule Take 160 mg by mouth every morning.     Current Facility-Administered Medications on File Prior to Visit  Medication Dose Route Frequency Provider Last Rate Last Admin  . methylPREDNISolone acetate (DEPO-MEDROL) injection 40 mg  40 mg Intra-articular Once Hudnall, Sharyn Lull, MD        No past surgical history on file.  No Known Allergies  Social History   Socioeconomic History  . Marital status:  Married    Spouse name: Not on file  . Number of children: Not on file  . Years of education: Not on file  . Highest education level: Not on file  Occupational History  . Not on file  Tobacco Use  . Smoking status: Never Smoker  . Smokeless tobacco: Never Used  Substance and Sexual Activity  . Alcohol use: Not on file  . Drug use: Not on file  . Sexual activity: Not on file  Other Topics Concern  . Not on file  Social History Narrative  . Not on file   Social Determinants of Health   Financial Resource Strain:   . Difficulty of Paying Living Expenses:   Food Insecurity:   . Worried About Charity fundraiser in the Last Year:   . Arboriculturist in the Last Year:   Transportation Needs:   . Film/video editor (Medical):   Marland Kitchen Lack of Transportation (Non-Medical):   Physical Activity:   . Days of Exercise per Week:   . Minutes of Exercise per Session:   Stress:   . Feeling of Stress :   Social Connections:   . Frequency of Communication with Friends and Family:   . Frequency of Social Gatherings with Friends and Family:   . Attends Religious Services:   .  Active Member of Clubs or Organizations:   . Attends Archivist Meetings:   Marland Kitchen Marital Status:   Intimate Partner Violence:   . Fear of Current or Ex-Partner:   . Emotionally Abused:   Marland Kitchen Physically Abused:   . Sexually Abused:     No family history on file.  BP (!) 168/70   Ht 5\' 10"  (1.778 m)   Wt 165 lb (74.8 kg)   BMI 23.68 kg/m   Review of Systems: See HPI above.     Objective:  Physical Exam:  Gen: NAD, comfortable in exam room  R Knee  Inspection: Right knee with significant effusion notable.  Bilateral knees without evidence of erythema, ecchymosis. Active ROM: Limited, 0-140d.  Some pain at end flexion.   Passive ROM: 3d passive hyperextension  Strength: 5/5 strength to resisted flexion/extension without pain  Patella: Negative patellar grind. No patellar facet tenderness. No  apprehension. No proximal or distal patellar tendon tenderness to palpation. No quad tendon tenderness to palpation.  Tibia: No tibial plateau, tibial tuberosity tenderness.  Joint line: + Medial joint line tenderness Popliteal: No popliteal tenderness to the insertional gastroc. No insertional biceps femoris, semimembranosis, semitendinosis tenderness.  Thessaly's: Positive on the right medial space for pain, negative for locking or catching Lachmans: Stable bilaterally with firm endpoint  Lever test: negative and Equal bilaterally Anterior/Posterior drawer: Stable bilaterally Varus/valgus stress at 0, 15d: Negative for pain, laxity   Limited ultrasound of the right knee showed significant effusion.    Assessment & Plan:  1.  Right knee osteoarthritis 2.  Possible right medial meniscus injury  Patient with known arthritis in left knee and history of reported osteoarthritis in the medial compartment of his right knee 15 years ago.  Given testing, he likely does have medial meniscal injury which is not uncommon with advanced osteoarthritis.  We discussed options including x-ray to evaluate further, aspiration and injection with steroid today, or more conservative therapies including oral anti-inflammatories and continuing topical anti-inflammatories.  He elected for aspiration and injection today.  We will plan to follow-up as needed if the pain returns, at that point would consider x-ray to confirm diagnosis and Orthovisc trial.  Procedure Note: Following the description of risks including infection bleeding, damage to surrounding structures, patient provided verbal/written consent for knee corticosteroid injection procedure. Timeout was performed.  Patient was sterilely prepped in the usual fashion with alcohol swab. Following topical anesthetization with ethyl chloride, and then 3 cc of bupivicaine anesthetic, the knee joint was identified using ultrasound and approached via the superolateral  approach.  The needle was visualized within the joint space, and 25cc of clear-yellow noncloudy, nonbloody fluid was aspirated.  The knee was then injected with a solution of 40mg  Kenalog + 3 cc bupivicaine. Patient tolerated well without complication. Precautions provided.

## 2020-04-04 NOTE — Patient Instructions (Signed)
Thank you for coming in to see [place] today! Please see below to review our plan for today's visit:   1.   You may continue to use Voltaren gel on your knee to help with pain 2.   Please monitor your symptoms, if they improve or resolve, you may continue your activities.  If your symptoms return quickly, please call so we can schedule an x-ray and consider Orthovisc supplementation 3.   Please continue to do knee strengthening exercises, but avoid exercises that cause pain   Please call the clinic at [phone] if your symptoms worsen or you have any concerns. It was our pleasure to serve you.       Dr. Dagoberto Ligas Dr. Karlton Lemon [facility]

## 2020-04-15 ENCOUNTER — Other Ambulatory Visit: Payer: Self-pay

## 2020-04-15 DIAGNOSIS — M1711 Unilateral primary osteoarthritis, right knee: Secondary | ICD-10-CM

## 2020-04-15 NOTE — Progress Notes (Signed)
Pt called wanting to get xrays for rt knee so he can get gel injections.

## 2020-04-16 ENCOUNTER — Ambulatory Visit
Admission: RE | Admit: 2020-04-16 | Discharge: 2020-04-16 | Disposition: A | Discharge status: Home / Self Care | Payer: Medicare Other | Source: Ambulatory Visit | Attending: Family Medicine | Admitting: Family Medicine

## 2020-04-16 DIAGNOSIS — M1711 Unilateral primary osteoarthritis, right knee: Secondary | ICD-10-CM

## 2020-04-16 DIAGNOSIS — I7090 Unspecified atherosclerosis: Secondary | ICD-10-CM | POA: Diagnosis not present

## 2020-04-16 DIAGNOSIS — M25461 Effusion, right knee: Secondary | ICD-10-CM | POA: Diagnosis not present

## 2020-05-07 ENCOUNTER — Other Ambulatory Visit: Payer: Self-pay

## 2020-05-07 ENCOUNTER — Encounter: Payer: Self-pay | Admitting: Family Medicine

## 2020-05-07 ENCOUNTER — Ambulatory Visit (INDEPENDENT_AMBULATORY_CARE_PROVIDER_SITE_OTHER): Payer: Medicare Other | Admitting: Family Medicine

## 2020-05-07 DIAGNOSIS — G8929 Other chronic pain: Secondary | ICD-10-CM | POA: Diagnosis not present

## 2020-05-07 DIAGNOSIS — M1711 Unilateral primary osteoarthritis, right knee: Secondary | ICD-10-CM

## 2020-05-07 DIAGNOSIS — M25561 Pain in right knee: Secondary | ICD-10-CM

## 2020-05-07 NOTE — Patient Instructions (Addendum)
It was great to meet you today! Thank you for letting me participate in your care!  Today, we discussed your knee arthritis and you are getting an injection of Orthovisc today. It is a series of 3 shots and it is important to complete the series. Please return in 1 week for your second of three gel injections. I also injected some numbing medication into your knee today to give you some temporary pain relief.  Be well, Harolyn Rutherford, DO PGY-4, Sports Medicine Fellow [place]

## 2020-05-07 NOTE — Progress Notes (Signed)
    SUBJECTIVE:   CHIEF COMPLAINT / HPI:   Right Knee Pain with known OA Patient presents today for his first Orthovisc injection today for known osteoarthritis of his right knee. He has not had any gel shots in his right knee before but has had them in his left knee and states he tolerated it well and got about 8 months of relief in his left knee from the gel shots. He is still wanting to the gel shot for his right knee today. His knee is still hurting more on the inside on the medial joint line.  PERTINENT  PMH / PSH: Knee OA bilaterally  OBJECTIVE:   BP (!) 148/82   Ht 5\' 10"  (1.778 m)   Wt 165 lb (74.8 kg)   BMI 23.68 kg/m   MSK: Knee, Right: Inspection was negative for erythema, ecchymosis. Mild effusion. No obvious bony abnormalities or signs of osteophyte development. Palpation yielded tenderness at the medial joint line.  ASSESSMENT/PLAN:   Right knee pain Known right knee OA. Still having pain and requesting Orthovisc today. See procedure note below.  Knee Injection: Written and verbal consent was obtained after discussing the risks and benefits of the procedure with the patient. Timeout was performed.  The anterior knee was cleansed in a sterile fashion with two alcohol pads. 3 cc Bupivicane was injected using an anteromedial approach using a 5 cc syringe and 25 gauge 1-1/2 in needle. The syringe was removed and then the Orthovisc syringe was attached and then injected into the knee. No complications were encountered. Minimal blood loss. A band aid was applied.     Nuala Alpha, DO PGY-4, Sports Medicine Fellow [place]

## 2020-05-07 NOTE — Assessment & Plan Note (Signed)
Known right knee OA. Still having pain and requesting Orthovisc today. See procedure note below.

## 2020-05-14 ENCOUNTER — Other Ambulatory Visit: Payer: Self-pay

## 2020-05-14 ENCOUNTER — Ambulatory Visit (INDEPENDENT_AMBULATORY_CARE_PROVIDER_SITE_OTHER): Payer: Medicare Other | Admitting: Family Medicine

## 2020-05-14 ENCOUNTER — Encounter: Payer: Self-pay | Admitting: Family Medicine

## 2020-05-14 DIAGNOSIS — M1711 Unilateral primary osteoarthritis, right knee: Secondary | ICD-10-CM | POA: Diagnosis not present

## 2020-05-14 DIAGNOSIS — M25561 Pain in right knee: Secondary | ICD-10-CM | POA: Diagnosis not present

## 2020-05-14 DIAGNOSIS — G8929 Other chronic pain: Secondary | ICD-10-CM | POA: Diagnosis not present

## 2020-05-14 NOTE — Assessment & Plan Note (Signed)
Known right knee pain secondary to OA. Second of third Orthovisc injections today.

## 2020-05-14 NOTE — Patient Instructions (Signed)
Patient declined  

## 2020-05-14 NOTE — Progress Notes (Signed)
° ° °  SUBJECTIVE:   CHIEF COMPLAINT / HPI:   Right Knee Pain [name] is a pleasant 79 year old male who presents today for his second in a series of 3 injections with orthovisc for his right knee.  He has a very moderate knee effusion in his right knee but states that it is not bothering him and he does not wish it to be drained today.  PERTINENT  PMH / PSH: Hx of bilateral knee OA  OBJECTIVE:   BP (!) 150/82    Ht 5\' 10"  (1.778 m)    Wt 165 lb (74.8 kg)    BMI 23.68 kg/m   Knee, Right: Inspection was negative for erythema, ecchymosis.  Mild effusion present.  No obvious bony abnormalities or signs of osteophyte development.  TTP at the medial joint line.  ASSESSMENT/PLAN:   Right knee pain Known right knee pain secondary to OA. Second of third Orthovisc injections today.    Knee Injection: Written and verbal consent was obtained after discussing the risks and benefits of the procedure with the patient. Timeout was performed.  The superiolateral portion of the knee was cleansed in a sterile fashion with two alcohol pads. Ultrasound guidance was used and probe was placed in long axis view to visualized superior patellar space. 3 cc Bupivicane was injected using an superiolateral approach using a 5 cc syringe and 25 gauge 1-1/2 in needle. The syringe was removed and then the Orthovisc syringe was attached and then injected into the knee. No complications were encountered. Minimal blood loss. A band aid was applied.     Nuala Alpha, DO PGY-4, Sports Medicine Fellow [place]

## 2020-05-21 ENCOUNTER — Other Ambulatory Visit: Payer: Self-pay

## 2020-05-21 ENCOUNTER — Encounter: Payer: Self-pay | Admitting: Family Medicine

## 2020-05-21 ENCOUNTER — Ambulatory Visit (INDEPENDENT_AMBULATORY_CARE_PROVIDER_SITE_OTHER): Payer: Medicare Other | Admitting: Family Medicine

## 2020-05-21 DIAGNOSIS — M1711 Unilateral primary osteoarthritis, right knee: Secondary | ICD-10-CM

## 2020-05-21 NOTE — Progress Notes (Signed)
PCP: Randy Fess, MD  Subjective:   HPI: Patient is a 79 y.o. male here for right knee pain.  8/25: Patient presents today for his first Orthovisc injection today for known osteoarthritis of his right knee. He has not had any gel shots in his right knee before but has had them in his left knee and states he tolerated it well and got about 8 months of relief in his left knee from the gel shots. He is still wanting to the gel shot for his right knee today. His knee is still hurting more on the inside on the medial joint line.  9/1: [name] is a pleasant 79 year old male who presents today for his second in a series of 3 injections with orthovisc for his right knee.  He has a very moderate knee effusion in his right knee but states that it is not bothering him and he does not wish it to be drained today.  9/8: Patient returns for third orthovisc injection. More sore[place] No new injuries.  History reviewed. No pertinent past medical history.  Current Outpatient Medications on File Prior to Visit  Medication Sig Dispense Refill  . aspirin 81 MG chewable tablet Chew 81 mg by mouth every morning.    . ciprofloxacin (CIPRO) 250 MG tablet Take 250 mg by mouth See admin instructions. Started 03/28/2012. Take 1 tablet by mouth  twice a day for 5 days then 1 tablet daily until procedure date    . Misc Natural Products (PROSTATE HEALTH PO) Take 1 tablet by mouth every morning.    Marland Kitchen OVER THE COUNTER MEDICATION Take by mouth daily. [employer]=Multivitamin Pack    . Red Yeast Rice 600 MG TABS Take 600 mg by mouth every morning.    . saw palmetto 160 MG capsule Take 160 mg by mouth every morning.     Current Facility-Administered Medications on File Prior to Visit  Medication Dose Route Frequency Provider Last Rate Last Admin  . methylPREDNISolone acetate (DEPO-MEDROL) injection 40 mg  40 mg Intra-articular Once Jakiah Goree, Sharyn Lull, MD        History reviewed.  No pertinent surgical history.  No Known Allergies  Social History   Socioeconomic History  . Marital status: Married    Spouse name: Not on file  . Number of children: Not on file  . Years of education: Not on file  . Highest education level: Not on file  Occupational History  . Not on file  Tobacco Use  . Smoking status: Never Smoker  . Smokeless tobacco: Never Used  Substance and Sexual Activity  . Alcohol use: Not on file  . Drug use: Not on file  . Sexual activity: Not on file  Other Topics Concern  . Not on file  Social History Narrative  . Not on file   Social Determinants of Health   Financial Resource Strain:   . Difficulty of Paying Living Expenses: Not on file  Food Insecurity:   . Worried About Charity fundraiser in the Last Year: Not on file  . Ran Out of Food in the Last Year: Not on file  Transportation Needs:   . Lack of Transportation (Medical): Not on file  . Lack of Transportation (Non-Medical): Not on file  Physical Activity:   . Days of Exercise per Week: Not on file  . Minutes of Exercise per Session: Not on file  Stress:   . Feeling of Stress : Not on  file  Social Connections:   . Frequency of Communication with Friends and Family: Not on file  . Frequency of Social Gatherings with Friends and Family: Not on file  . Attends Religious Services: Not on file  . Active Member of Clubs or Organizations: Not on file  . Attends Archivist Meetings: Not on file  . Marital Status: Not on file  Intimate Partner Violence:   . Fear of Current or Ex-Partner: Not on file  . Emotionally Abused: Not on file  . Physically Abused: Not on file  . Sexually Abused: Not on file    History reviewed. No pertinent family history.  BP (!) 158/78   Ht 5\' 10"  (1.778 m)   Wt 165 lb (74.8 kg)   BMI 23.68 kg/m   No flowsheet data found.  No flowsheet data found.  Review of Systems: See HPI above.     Objective:  Physical Exam:  Gen: NAD,  comfortable in exam room  Rest of exam not repeated today.   Assessment & Plan:  1. Right knee pain - 2/2 arthritis.  Third orthovisc injection given today.  Previously discussed topical medications, tylenol, supplements.  Let us know how he's doing in about 4 weeks.  After informed written consent timeout was performed, patient was lying supine on exam table. Right knee was prepped with alcohol swab and utilizing superolateral approach, patient's right knee was injected intraarticularly with 54mL bupivicaine followed by orthovisc. Patient tolerated the procedure well without immediate complications.

## 2020-07-02 DIAGNOSIS — D225 Melanocytic nevi of trunk: Secondary | ICD-10-CM | POA: Diagnosis not present

## 2020-07-02 DIAGNOSIS — L821 Other seborrheic keratosis: Secondary | ICD-10-CM | POA: Diagnosis not present

## 2020-07-02 DIAGNOSIS — D2272 Melanocytic nevi of left lower limb, including hip: Secondary | ICD-10-CM | POA: Diagnosis not present

## 2020-07-02 DIAGNOSIS — Z85820 Personal history of malignant melanoma of skin: Secondary | ICD-10-CM | POA: Diagnosis not present

## 2020-07-02 DIAGNOSIS — L570 Actinic keratosis: Secondary | ICD-10-CM | POA: Diagnosis not present

## 2020-07-02 DIAGNOSIS — L814 Other melanin hyperpigmentation: Secondary | ICD-10-CM | POA: Diagnosis not present

## 2020-07-02 DIAGNOSIS — Z86018 Personal history of other benign neoplasm: Secondary | ICD-10-CM | POA: Diagnosis not present

## 2020-12-01 DIAGNOSIS — L570 Actinic keratosis: Secondary | ICD-10-CM | POA: Diagnosis not present

## 2021-01-22 DIAGNOSIS — H61002 Unspecified perichondritis of left external ear: Secondary | ICD-10-CM | POA: Diagnosis not present

## 2021-01-22 DIAGNOSIS — L570 Actinic keratosis: Secondary | ICD-10-CM | POA: Diagnosis not present

## 2021-01-22 DIAGNOSIS — L929 Granulomatous disorder of the skin and subcutaneous tissue, unspecified: Secondary | ICD-10-CM | POA: Diagnosis not present

## 2021-01-22 DIAGNOSIS — D485 Neoplasm of uncertain behavior of skin: Secondary | ICD-10-CM | POA: Diagnosis not present

## 2021-01-22 DIAGNOSIS — I10 Essential (primary) hypertension: Secondary | ICD-10-CM | POA: Diagnosis not present

## 2021-02-16 DIAGNOSIS — L570 Actinic keratosis: Secondary | ICD-10-CM | POA: Diagnosis not present

## 2021-05-11 DIAGNOSIS — L570 Actinic keratosis: Secondary | ICD-10-CM | POA: Diagnosis not present

## 2021-05-26 DIAGNOSIS — H18413 Arcus senilis, bilateral: Secondary | ICD-10-CM | POA: Diagnosis not present

## 2021-05-26 DIAGNOSIS — H2513 Age-related nuclear cataract, bilateral: Secondary | ICD-10-CM | POA: Diagnosis not present

## 2021-05-26 DIAGNOSIS — H43392 Other vitreous opacities, left eye: Secondary | ICD-10-CM | POA: Diagnosis not present

## 2021-05-26 DIAGNOSIS — H43813 Vitreous degeneration, bilateral: Secondary | ICD-10-CM | POA: Diagnosis not present

## 2021-05-26 DIAGNOSIS — H43811 Vitreous degeneration, right eye: Secondary | ICD-10-CM | POA: Diagnosis not present

## 2021-07-08 DIAGNOSIS — L821 Other seborrheic keratosis: Secondary | ICD-10-CM | POA: Diagnosis not present

## 2021-07-08 DIAGNOSIS — Z85820 Personal history of malignant melanoma of skin: Secondary | ICD-10-CM | POA: Diagnosis not present

## 2021-07-08 DIAGNOSIS — D225 Melanocytic nevi of trunk: Secondary | ICD-10-CM | POA: Diagnosis not present

## 2021-07-08 DIAGNOSIS — Z23 Encounter for immunization: Secondary | ICD-10-CM | POA: Diagnosis not present

## 2021-07-08 DIAGNOSIS — L814 Other melanin hyperpigmentation: Secondary | ICD-10-CM | POA: Diagnosis not present

## 2021-07-08 DIAGNOSIS — L820 Inflamed seborrheic keratosis: Secondary | ICD-10-CM | POA: Diagnosis not present

## 2021-07-08 DIAGNOSIS — L570 Actinic keratosis: Secondary | ICD-10-CM | POA: Diagnosis not present

## 2021-07-08 DIAGNOSIS — D2272 Melanocytic nevi of left lower limb, including hip: Secondary | ICD-10-CM | POA: Diagnosis not present

## 2021-07-08 DIAGNOSIS — Z86018 Personal history of other benign neoplasm: Secondary | ICD-10-CM | POA: Diagnosis not present

## 2021-08-10 DIAGNOSIS — R030 Elevated blood-pressure reading, without diagnosis of hypertension: Secondary | ICD-10-CM | POA: Diagnosis not present

## 2021-08-10 DIAGNOSIS — J329 Chronic sinusitis, unspecified: Secondary | ICD-10-CM | POA: Diagnosis not present

## 2021-08-10 DIAGNOSIS — R059 Cough, unspecified: Secondary | ICD-10-CM | POA: Diagnosis not present

## 2021-12-14 ENCOUNTER — Ambulatory Visit (INDEPENDENT_AMBULATORY_CARE_PROVIDER_SITE_OTHER): Payer: Medicare Other | Admitting: Family Medicine

## 2021-12-14 ENCOUNTER — Encounter: Payer: Self-pay | Admitting: Family Medicine

## 2021-12-14 DIAGNOSIS — M1711 Unilateral primary osteoarthritis, right knee: Secondary | ICD-10-CM | POA: Diagnosis not present

## 2021-12-14 NOTE — Progress Notes (Signed)
PCP: Hulan Fess, MD ? ?Subjective:  ? ?HPI: ?Patient is a 81 y.o. male here for right knee arthritis. ? ?Patient returns to start visco series for his right knee again. ?No new issues, injuries. ? ?History reviewed. No pertinent past medical history. ? ?Current Outpatient Medications on File Prior to Visit  ?Medication Sig Dispense Refill  ? aspirin 81 MG chewable tablet Chew 81 mg by mouth every morning.    ? ciprofloxacin (CIPRO) 250 MG tablet Take 250 mg by mouth See admin instructions. Started 03/28/2012. Take 1 tablet by mouth  twice a day for 5 days then 1 tablet daily until procedure date    ? Misc Natural Products (PROSTATE HEALTH PO) Take 1 tablet by mouth every morning.    ? OVER THE COUNTER MEDICATION Take by mouth daily. [employer]=Multivitamin Pack    ? Red Yeast Rice 600 MG TABS Take 600 mg by mouth every morning.    ? saw palmetto 160 MG capsule Take 160 mg by mouth every morning.    ? ?Current Facility-Administered Medications on File Prior to Visit  ?Medication Dose Route Frequency Provider Last Rate Last Admin  ? methylPREDNISolone acetate (DEPO-MEDROL) injection 40 mg  40 mg Intra-articular Once Anaiyah Anglemyer, Sharyn Lull, MD      ? ? ?History reviewed. No pertinent surgical history. ? ?No Known Allergies ? ?BP (!) 175/82   Ht '5\' 9"'$  (1.753 m)   Wt 165 lb (74.8 kg)   BMI 24.37 kg/m?  ? ?   ? View : No data to display.  ?  ?  ?  ? ? ?   ? View : No data to display.  ?  ?  ?  ? ? ?    ?Objective:  ?Physical Exam: ? ?Gen: NAD, comfortable in exam room ? ?Right knee: ?No gross deformity, ecchymoses.  Minimal effusion. ?No TTP. ?FROM with normal strength. ?NV intact distally. ?  ?Assessment & Plan:  ?1. Right knee osteoarthritis - orthovisc series started again today - f/u in 1 week for second injection. ? ?After informed written consent timeout was performed, patient was lying supine on exam table. Right knee was prepped with alcohol swab and utilizing superolateral approach with ultrasound guidance, patient's  right knee was injected intraarticularly with 35m lidocaine followed by orthovisc. Patient tolerated the procedure well without immediate complications. ?

## 2021-12-16 ENCOUNTER — Ambulatory Visit: Payer: Medicare Other | Admitting: Sports Medicine

## 2021-12-23 ENCOUNTER — Ambulatory Visit (INDEPENDENT_AMBULATORY_CARE_PROVIDER_SITE_OTHER): Payer: Medicare Other | Admitting: Sports Medicine

## 2021-12-23 VITALS — BP 150/84

## 2021-12-23 DIAGNOSIS — G8929 Other chronic pain: Secondary | ICD-10-CM | POA: Diagnosis not present

## 2021-12-23 DIAGNOSIS — M25561 Pain in right knee: Secondary | ICD-10-CM

## 2021-12-23 DIAGNOSIS — M1711 Unilateral primary osteoarthritis, right knee: Secondary | ICD-10-CM | POA: Diagnosis not present

## 2021-12-23 NOTE — Progress Notes (Signed)
PCP: Hulan Fess, MD ? ?Subjective:  ? ?HPI: ?Randy Bradshaw is a 81 y.o. male here for right knee pain 2/2 osteoarthritis.  ? ?He presents for viscosupplementation injection #2 today. He did well without any complications from his injection last week. Hoping to get good relief from this set. He denies any new injuries.  He does feel like the knee is somewhat stronger after his first injection. ? ? ?BP (!) 150/84  ? ?   ? View : No data to display.  ?  ?  ?  ? ? ?   ? View : No data to display.  ?  ?  ?  ? ? ?    ?Objective:  ?Physical Exam: ? ?Gen: Well-appearing, in no acute distress; non-toxic ?CV: Regular Rate. Well-perfused. Warm.  ?Resp: Breathing unlabored on room air; no wheezing. ?Psych: Fluid speech in conversation; appropriate affect; normal thought process ?Neuro: Sensation intact throughout. No gross coordination deficits.  ?MSK:  ?- Right knee: Inspection yields no erythema, ecchymosis or gross deformity.  There is a trace effusion.  No specific TTP of either joint line.  Range of motion from 0-125 degrees.  Calf soft and nontender.  Neurovascular intact distally. ?  ?Assessment & Plan:  ?1. Right knee pain 2/2 osteoarthritis ? ?Procedure: Right knee injection, Viscosupplementation ?After discussion on risks/benefits/indications, informed written consent timeout was performed, patient was seated on exam table. The patient's left knee was prepped with Betadine and alcohol swab and utilizing an anteromedial approach, the patient's knee was anesthesized with 3 cc of lidocaine 1% plain with guidance into the knee joint. Once within the joint, a sterile syringe swap was performed with subsequent injection of 168units of hyaluronic acid on a pre-filled Orthovisc syringe into the right knee joint. Patient tolerated the procedure well without immediate complications. ? ?- Orthovisc injection #2 given into the right knee today ?- May use ice and over-the-counter anti-inflammatories for any postinjection pain ?- He  will present in 2 weeks (traveling next week) for 3/3 injection of this series ? ?Elba Barman, DO ?PGY-4, Sports Medicine Fellow ?[place] ? ?This note was dictated using Dragon naturally speaking software and may contain errors in syntax, spelling, or content which have not been identified prior to signing this note.  ? ?I was the preceptor for this visit and available for immediate consultation ?Shellia Cleverly, DO ?

## 2022-01-06 ENCOUNTER — Ambulatory Visit: Payer: Medicare Other | Admitting: Sports Medicine

## 2022-01-06 ENCOUNTER — Ambulatory Visit (INDEPENDENT_AMBULATORY_CARE_PROVIDER_SITE_OTHER): Payer: Medicare Other | Admitting: Family Medicine

## 2022-01-06 ENCOUNTER — Encounter: Payer: Self-pay | Admitting: Family Medicine

## 2022-01-06 VITALS — BP 170/84 | Ht 69.0 in | Wt 165.0 lb

## 2022-01-06 DIAGNOSIS — M1711 Unilateral primary osteoarthritis, right knee: Secondary | ICD-10-CM

## 2022-01-06 NOTE — Progress Notes (Signed)
PCP: Hulan Fess, MD ? ?Subjective:  ? ?HPI: ?Patient is a 81 y.o. male here for right knee pain. ? ?Patient returns for his third orthovisc injection. ?Overall he is doing well.  Left knee starting to bother him some. ? ?History reviewed. No pertinent past medical history. ? ?Current Outpatient Medications on File Prior to Visit  ?Medication Sig Dispense Refill  ? aspirin 81 MG chewable tablet Chew 81 mg by mouth every morning.    ? ciprofloxacin (CIPRO) 250 MG tablet Take 250 mg by mouth See admin instructions. Started 03/28/2012. Take 1 tablet by mouth  twice a day for 5 days then 1 tablet daily until procedure date    ? Misc Natural Products (PROSTATE HEALTH PO) Take 1 tablet by mouth every morning.    ? OVER THE COUNTER MEDICATION Take by mouth daily. [employer]=Multivitamin Pack    ? Red Yeast Rice 600 MG TABS Take 600 mg by mouth every morning.    ? saw palmetto 160 MG capsule Take 160 mg by mouth every morning.    ? ?Current Facility-Administered Medications on File Prior to Visit  ?Medication Dose Route Frequency Provider Last Rate Last Admin  ? methylPREDNISolone acetate (DEPO-MEDROL) injection 40 mg  40 mg Intra-articular Once Malani Lees, Sharyn Lull, MD      ? ? ?History reviewed. No pertinent surgical history. ? ?No Known Allergies ? ?BP (!) 170/84   Ht '5\' 9"'$  (1.753 m)   Wt 165 lb (74.8 kg)   BMI 24.37 kg/m?  ? ?   ? View : No data to display.  ?  ?  ?  ? ? ?   ? View : No data to display.  ?  ?  ?  ? ? ?    ?Objective:  ?Physical Exam: ? ?Gen: NAD, comfortable in exam room ? ?Knee exam not repeated today. ?  ?Assessment & Plan:  ?1. Right knee arthritis - third orthovisc injection given today.  Will hold fourth injection.  Fu prn. ? ?After informed written consent timeout was performed, patient was lying supine on exam table. Right knee was prepped with alcohol swab and utilizing superolateral approach with ultrasound guidance, patient's right knee was injected intraarticularly with 40m lidocaine followed by  orthovisc. Patient tolerated the procedure well without immediate complications. ? ?

## 2022-01-08 DIAGNOSIS — Z20822 Contact with and (suspected) exposure to covid-19: Secondary | ICD-10-CM | POA: Diagnosis not present

## 2022-01-12 DIAGNOSIS — L723 Sebaceous cyst: Secondary | ICD-10-CM | POA: Diagnosis not present

## 2022-02-19 IMAGING — DX DG KNEE AP/LAT W/ SUNRISE*R*
3 series · 3 of 3 positions shown · non-contrast
Comparison: None

CLINICAL DATA: Right knee pain for 1 month, no known injury

EXAM:
RIGHT KNEE 3 VIEWS

[dg knee ap/lat w/ sunrise right (1 of 3)]
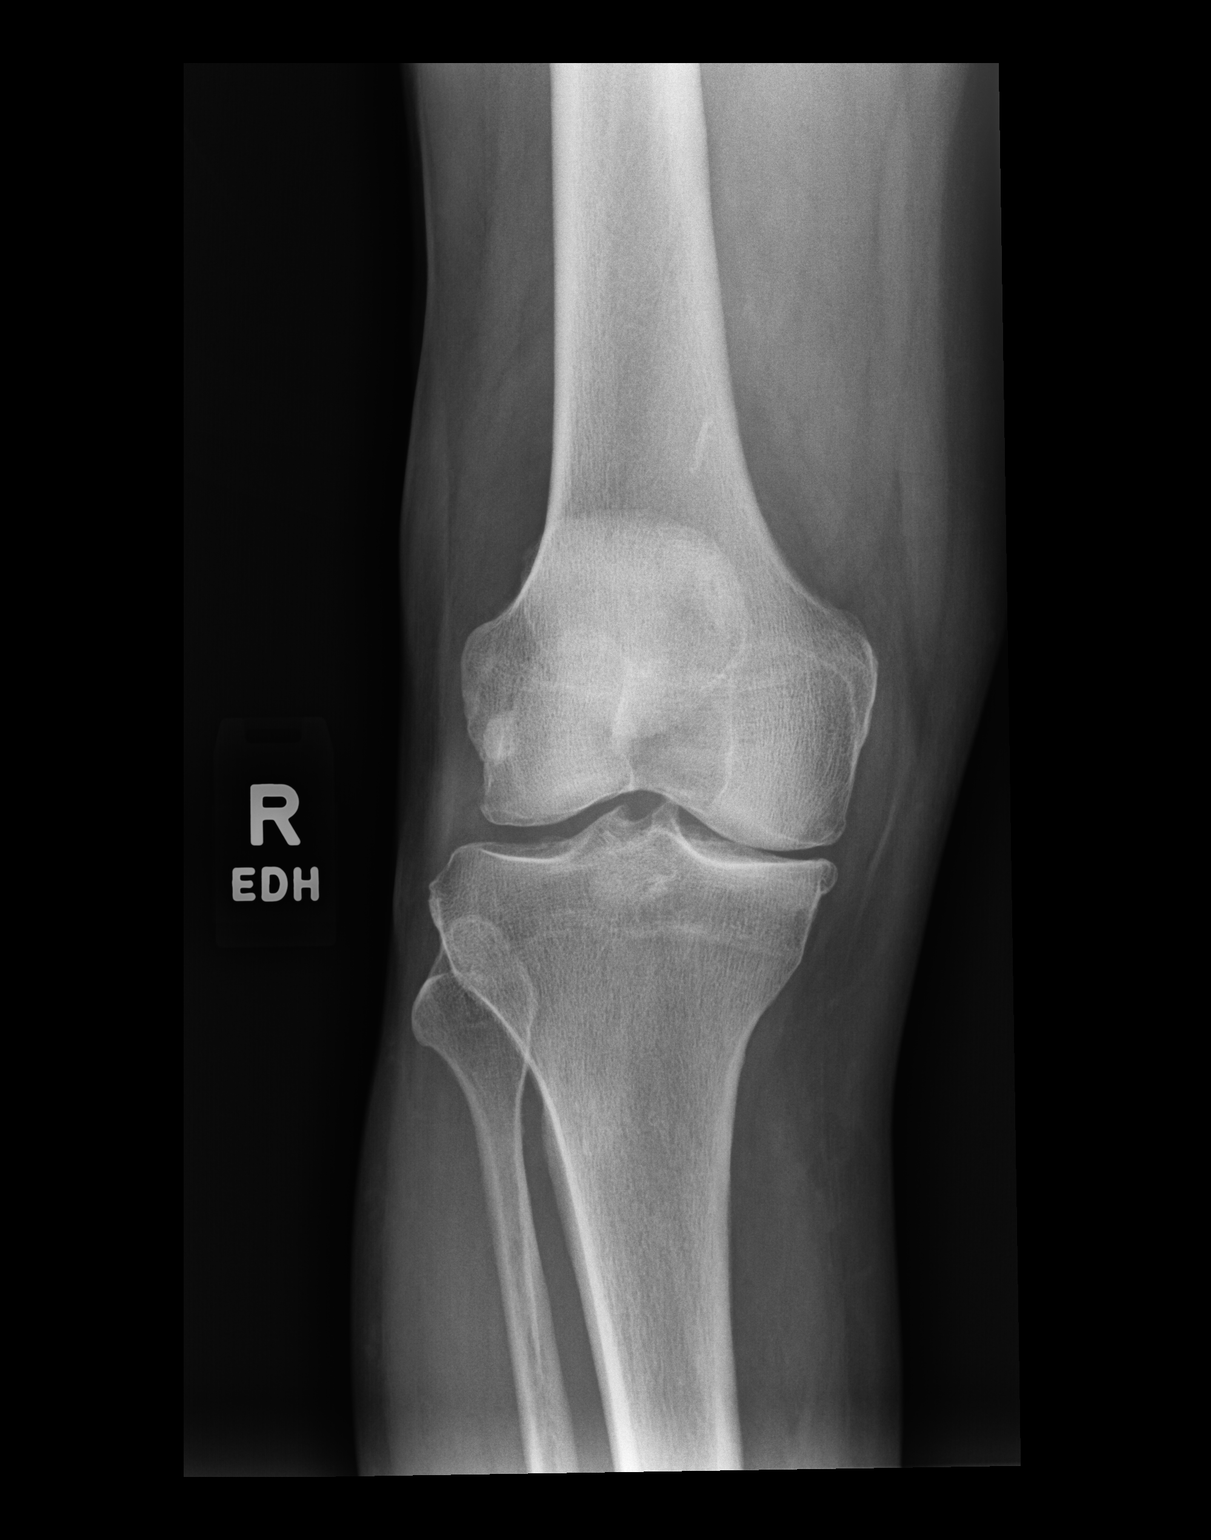

[dg knee ap/lat w/ sunrise right (2 of 3)]
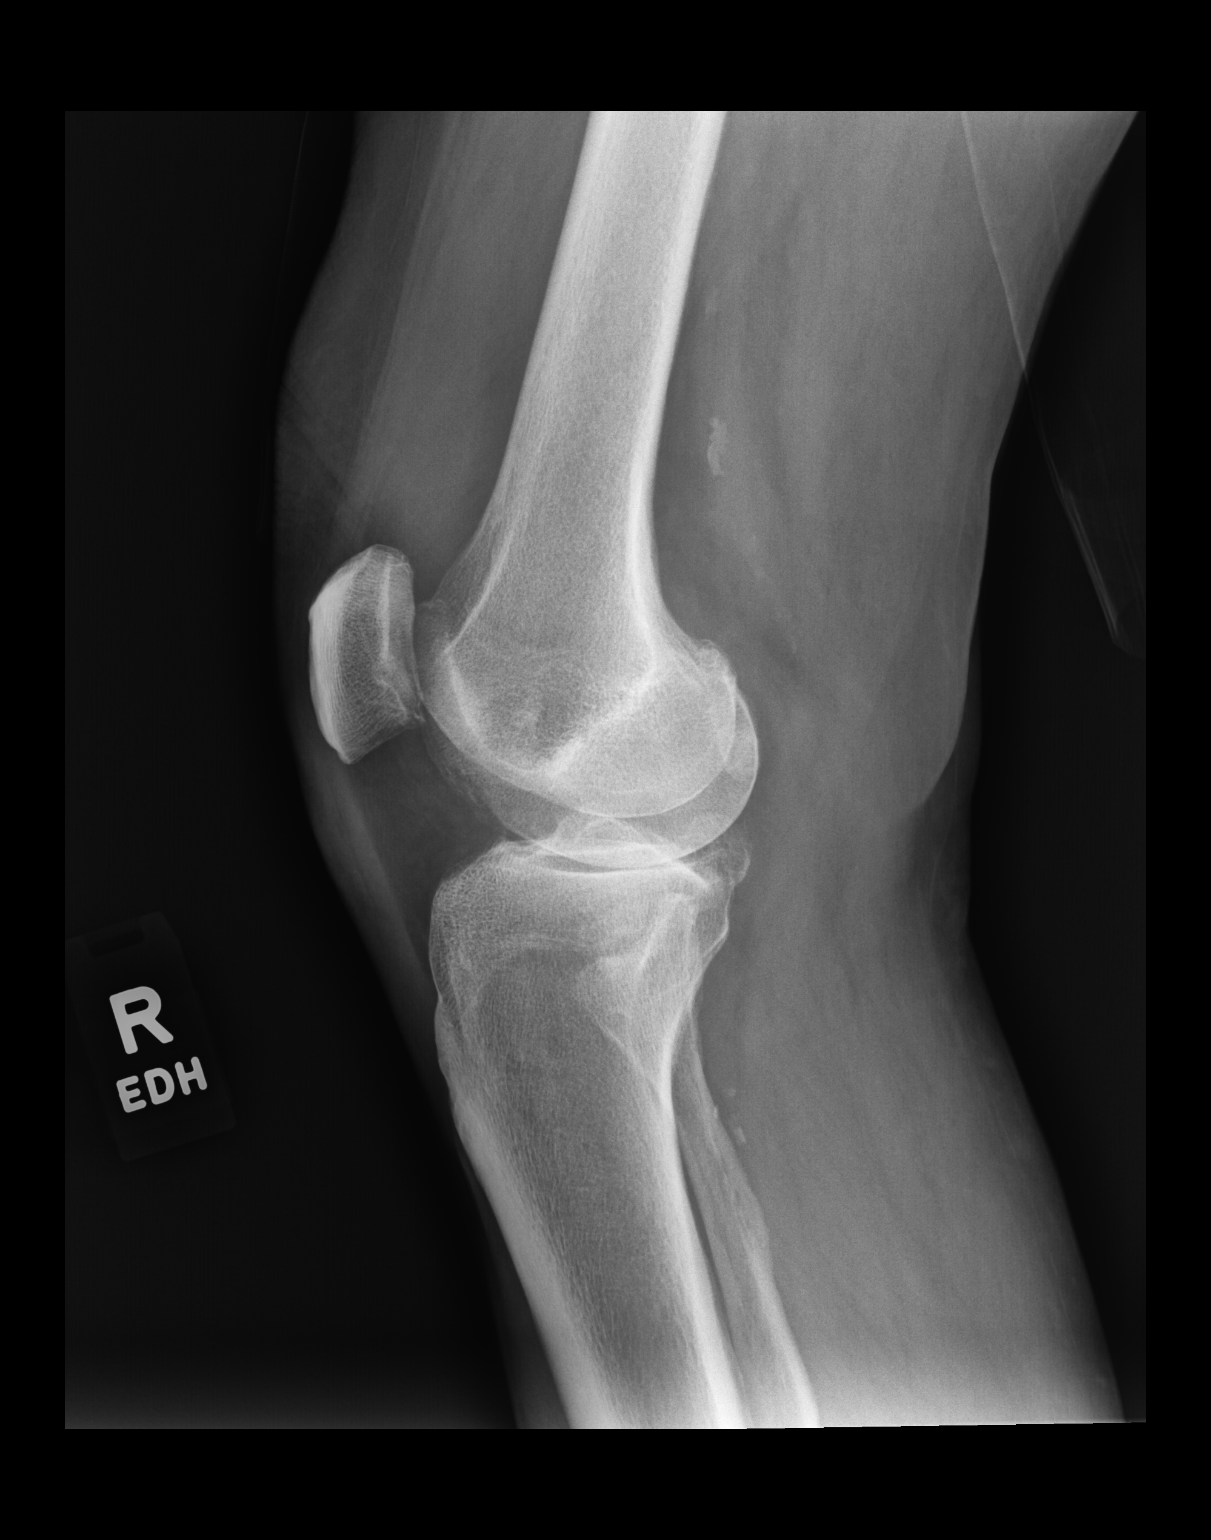

[dg knee ap/lat w/ sunrise right (3 of 3)]
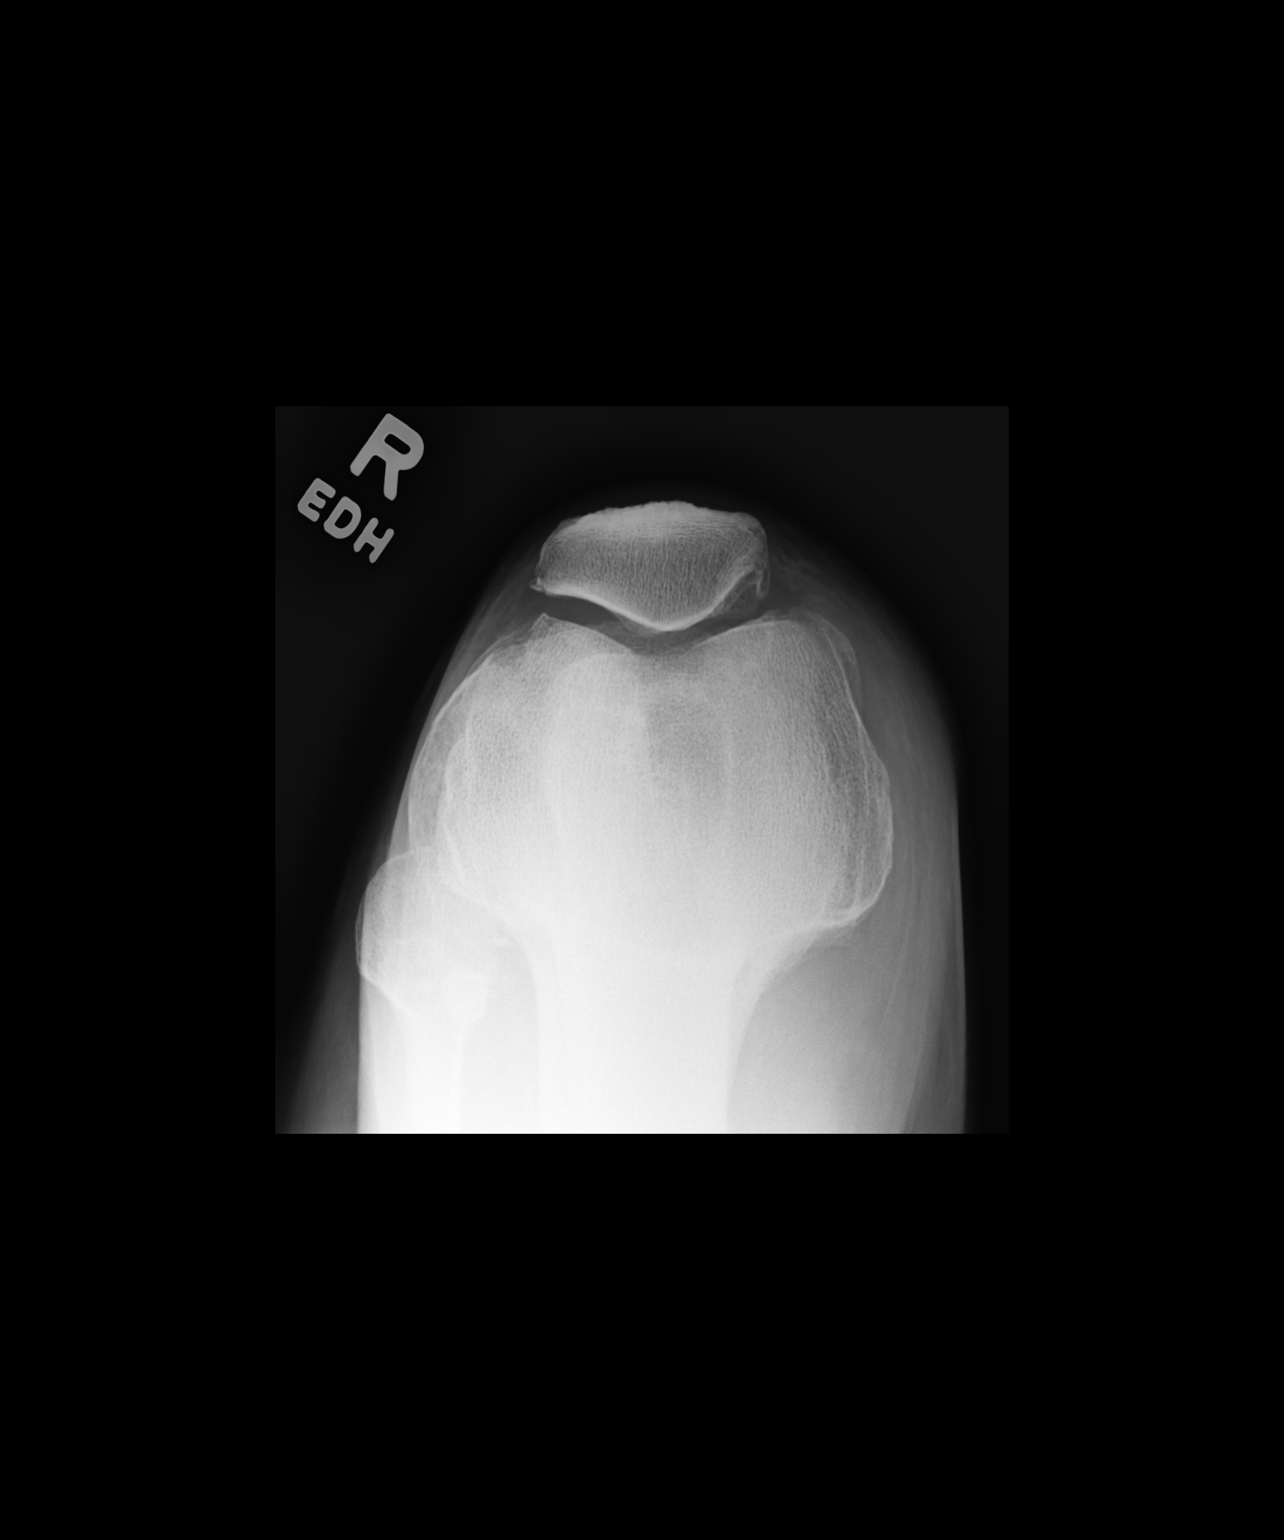

[3 of 3 positions shown; findings below may reference images not displayed]

FINDINGS: Moderate suprapatellar joint effusion. No acute bony abnormality.
Specifically, no fracture, subluxation, or dislocation. The osseous
structures appear diffusely demineralized which may limit detection
of small or nondisplaced fractures. Moderate tricompartmental
degenerative changes of the knee with pronounced medial
compartmental narrowing and diffuse periarticular spurring most
pronounced at the patellofemoral compartment along the medial
patellar facet. Vascular calcium in the posterior soft tissues.
IMPRESSION: 1. Moderate suprapatellar joint effusion. No acute osseous
abnormality.
2. Moderate tricompartmental degenerative changes of the knee, worst
at the medial and patellofemoral compartments.
3. Atherosclerosis.

## 2022-05-26 DIAGNOSIS — H43813 Vitreous degeneration, bilateral: Secondary | ICD-10-CM | POA: Diagnosis not present

## 2022-06-24 DIAGNOSIS — D225 Melanocytic nevi of trunk: Secondary | ICD-10-CM | POA: Diagnosis not present

## 2022-06-24 DIAGNOSIS — L570 Actinic keratosis: Secondary | ICD-10-CM | POA: Diagnosis not present

## 2022-06-24 DIAGNOSIS — Z86018 Personal history of other benign neoplasm: Secondary | ICD-10-CM | POA: Diagnosis not present

## 2022-06-24 DIAGNOSIS — L821 Other seborrheic keratosis: Secondary | ICD-10-CM | POA: Diagnosis not present

## 2022-06-24 DIAGNOSIS — L814 Other melanin hyperpigmentation: Secondary | ICD-10-CM | POA: Diagnosis not present

## 2022-06-24 DIAGNOSIS — Z85820 Personal history of malignant melanoma of skin: Secondary | ICD-10-CM | POA: Diagnosis not present

## 2022-06-24 DIAGNOSIS — D485 Neoplasm of uncertain behavior of skin: Secondary | ICD-10-CM | POA: Diagnosis not present

## 2022-09-20 DIAGNOSIS — H25013 Cortical age-related cataract, bilateral: Secondary | ICD-10-CM | POA: Diagnosis not present

## 2022-09-20 DIAGNOSIS — H02831 Dermatochalasis of right upper eyelid: Secondary | ICD-10-CM | POA: Diagnosis not present

## 2022-09-20 DIAGNOSIS — H2513 Age-related nuclear cataract, bilateral: Secondary | ICD-10-CM | POA: Diagnosis not present

## 2022-09-20 DIAGNOSIS — H25043 Posterior subcapsular polar age-related cataract, bilateral: Secondary | ICD-10-CM | POA: Diagnosis not present

## 2022-09-20 DIAGNOSIS — H2511 Age-related nuclear cataract, right eye: Secondary | ICD-10-CM | POA: Diagnosis not present

## 2022-10-12 DIAGNOSIS — H2511 Age-related nuclear cataract, right eye: Secondary | ICD-10-CM | POA: Diagnosis not present

## 2022-10-12 DIAGNOSIS — H2512 Age-related nuclear cataract, left eye: Secondary | ICD-10-CM | POA: Diagnosis not present

## 2022-10-12 DIAGNOSIS — H269 Unspecified cataract: Secondary | ICD-10-CM | POA: Diagnosis not present

## 2022-10-19 DIAGNOSIS — H2512 Age-related nuclear cataract, left eye: Secondary | ICD-10-CM | POA: Diagnosis not present

## 2022-10-19 DIAGNOSIS — H269 Unspecified cataract: Secondary | ICD-10-CM | POA: Diagnosis not present

## 2023-08-03 DIAGNOSIS — H43813 Vitreous degeneration, bilateral: Secondary | ICD-10-CM | POA: Diagnosis not present
